# Patient Record
Sex: Female | Born: 1945
Health system: Southern US, Community
[De-identification: ages and names within clinical notes are randomized; demographics above are authoritative.]

## PROBLEM LIST (undated history)

## (undated) DIAGNOSIS — E7800 Pure hypercholesterolemia, unspecified: Secondary | ICD-10-CM

## (undated) DIAGNOSIS — E162 Hypoglycemia, unspecified: Secondary | ICD-10-CM

## (undated) DIAGNOSIS — E538 Deficiency of other specified B group vitamins: Secondary | ICD-10-CM

## (undated) DIAGNOSIS — I10 Essential (primary) hypertension: Secondary | ICD-10-CM

## (undated) DIAGNOSIS — E785 Hyperlipidemia, unspecified: Secondary | ICD-10-CM

## (undated) DIAGNOSIS — E119 Type 2 diabetes mellitus without complications: Secondary | ICD-10-CM

## (undated) DIAGNOSIS — M1712 Unilateral primary osteoarthritis, left knee: Secondary | ICD-10-CM

## (undated) DIAGNOSIS — D649 Anemia, unspecified: Secondary | ICD-10-CM

## (undated) DIAGNOSIS — F419 Anxiety disorder, unspecified: Secondary | ICD-10-CM

## (undated) DIAGNOSIS — E079 Disorder of thyroid, unspecified: Secondary | ICD-10-CM

## (undated) DIAGNOSIS — E039 Hypothyroidism, unspecified: Secondary | ICD-10-CM

## (undated) DIAGNOSIS — M810 Age-related osteoporosis without current pathological fracture: Secondary | ICD-10-CM

## (undated) DIAGNOSIS — M1990 Unspecified osteoarthritis, unspecified site: Secondary | ICD-10-CM

## (undated) DIAGNOSIS — L409 Psoriasis, unspecified: Secondary | ICD-10-CM

## (undated) HISTORY — PX: APPENDECTOMY: SHX54

## (undated) HISTORY — PX: CHOLECYSTECTOMY: SHX55

## (undated) HISTORY — PX: FOOT SURGERY: SHX648

## (undated) HISTORY — PX: TONSILLECTOMY: SUR1361

## (undated) HISTORY — DX: Unspecified osteoarthritis, unspecified site: M19.90

## (undated) HISTORY — PX: COLONOSCOPY: SHX174

## (undated) HISTORY — DX: Type 2 diabetes mellitus without complications: E11.9

---

## 2004-08-07 ENCOUNTER — Ambulatory Visit: Payer: Self-pay | Admitting: Unknown Physician Specialty

## 2005-06-08 ENCOUNTER — Ambulatory Visit: Payer: Self-pay | Admitting: Unknown Physician Specialty

## 2006-04-19 ENCOUNTER — Ambulatory Visit: Payer: Self-pay | Admitting: Unknown Physician Specialty

## 2006-05-18 ENCOUNTER — Ambulatory Visit: Payer: Self-pay | Admitting: Unknown Physician Specialty

## 2006-06-18 ENCOUNTER — Ambulatory Visit: Payer: Self-pay | Admitting: Unknown Physician Specialty

## 2006-07-05 ENCOUNTER — Ambulatory Visit: Payer: Self-pay | Admitting: Unknown Physician Specialty

## 2007-12-06 ENCOUNTER — Ambulatory Visit: Payer: Self-pay | Admitting: Unknown Physician Specialty

## 2007-12-13 ENCOUNTER — Ambulatory Visit: Payer: Self-pay | Admitting: Unknown Physician Specialty

## 2008-01-29 ENCOUNTER — Ambulatory Visit: Payer: Self-pay | Admitting: Unknown Physician Specialty

## 2008-11-28 ENCOUNTER — Ambulatory Visit: Payer: Self-pay | Admitting: Unknown Physician Specialty

## 2008-12-16 ENCOUNTER — Ambulatory Visit: Payer: Self-pay | Admitting: Unknown Physician Specialty

## 2009-01-02 ENCOUNTER — Ambulatory Visit: Payer: Self-pay | Admitting: Unknown Physician Specialty

## 2010-01-06 ENCOUNTER — Ambulatory Visit: Payer: Self-pay | Admitting: Unknown Physician Specialty

## 2010-06-04 ENCOUNTER — Encounter: Payer: Self-pay | Admitting: Sports Medicine

## 2010-06-18 ENCOUNTER — Encounter: Payer: Self-pay | Admitting: Sports Medicine

## 2010-07-18 ENCOUNTER — Encounter: Payer: Self-pay | Admitting: Sports Medicine

## 2011-01-14 ENCOUNTER — Ambulatory Visit: Payer: Self-pay | Admitting: Podiatry

## 2011-02-02 ENCOUNTER — Ambulatory Visit: Payer: Self-pay | Admitting: Family Medicine

## 2012-03-07 ENCOUNTER — Ambulatory Visit: Payer: Self-pay | Admitting: Family Medicine

## 2013-04-06 ENCOUNTER — Ambulatory Visit: Payer: Self-pay | Admitting: Family Medicine

## 2013-04-09 ENCOUNTER — Ambulatory Visit: Payer: Self-pay | Admitting: Unknown Physician Specialty

## 2014-04-08 ENCOUNTER — Emergency Department: Payer: Self-pay | Admitting: Emergency Medicine

## 2014-04-08 LAB — CBC WITH DIFFERENTIAL/PLATELET
BASOS PCT: 0.3 %
Basophil #: 0 10*3/uL (ref 0.0–0.1)
Eosinophil #: 0 10*3/uL (ref 0.0–0.7)
Eosinophil %: 0.3 %
HCT: 43.5 % (ref 35.0–47.0)
HGB: 13.7 g/dL (ref 12.0–16.0)
Lymphocyte #: 0.5 10*3/uL — ABNORMAL LOW (ref 1.0–3.6)
Lymphocyte %: 3.4 %
MCH: 30.3 pg (ref 26.0–34.0)
MCHC: 31.6 g/dL — ABNORMAL LOW (ref 32.0–36.0)
MCV: 96 fL (ref 80–100)
MONOS PCT: 4.6 %
Monocyte #: 0.6 x10 3/mm (ref 0.2–0.9)
NEUTROS PCT: 91.4 %
Neutrophil #: 12.5 10*3/uL — ABNORMAL HIGH (ref 1.4–6.5)
PLATELETS: 187 10*3/uL (ref 150–440)
RBC: 4.53 10*6/uL (ref 3.80–5.20)
RDW: 13.5 % (ref 11.5–14.5)
WBC: 13.7 10*3/uL — AB (ref 3.6–11.0)

## 2014-04-08 LAB — COMPREHENSIVE METABOLIC PANEL
ALT: 25 U/L (ref 12–78)
ANION GAP: 9 (ref 7–16)
Albumin: 4.1 g/dL (ref 3.4–5.0)
Alkaline Phosphatase: 88 U/L
BILIRUBIN TOTAL: 1 mg/dL (ref 0.2–1.0)
BUN: 21 mg/dL — AB (ref 7–18)
Calcium, Total: 10.2 mg/dL — ABNORMAL HIGH (ref 8.5–10.1)
Chloride: 102 mmol/L (ref 98–107)
Co2: 25 mmol/L (ref 21–32)
Creatinine: 1.14 mg/dL (ref 0.60–1.30)
EGFR (African American): 56 — ABNORMAL LOW
GFR CALC NON AF AMER: 49 — AB
Glucose: 344 mg/dL — ABNORMAL HIGH (ref 65–99)
Osmolality: 289 (ref 275–301)
Potassium: 4.7 mmol/L (ref 3.5–5.1)
SGOT(AST): 34 U/L (ref 15–37)
Sodium: 136 mmol/L (ref 136–145)
Total Protein: 7.8 g/dL (ref 6.4–8.2)

## 2014-04-08 LAB — URINALYSIS, COMPLETE
Bacteria: NONE SEEN
Bilirubin,UR: NEGATIVE
LEUKOCYTE ESTERASE: NEGATIVE
Nitrite: NEGATIVE
PH: 5 (ref 4.5–8.0)
Protein: NEGATIVE
RBC,UR: <1 /HPF (ref 0–5)
Specific Gravity: 1.029 (ref 1.003–1.030)
WBC UR: 1 /HPF (ref 0–5)

## 2014-05-24 ENCOUNTER — Ambulatory Visit: Payer: Self-pay | Admitting: Family Medicine

## 2015-06-18 ENCOUNTER — Other Ambulatory Visit: Payer: Self-pay | Admitting: Family Medicine

## 2015-06-18 DIAGNOSIS — Z1231 Encounter for screening mammogram for malignant neoplasm of breast: Secondary | ICD-10-CM

## 2015-07-02 ENCOUNTER — Ambulatory Visit: Payer: Self-pay | Attending: Family Medicine

## 2015-07-29 ENCOUNTER — Ambulatory Visit
Admission: RE | Admit: 2015-07-29 | Discharge: 2015-07-29 | Disposition: A | Discharge status: Home / Self Care | Payer: PPO | Source: Ambulatory Visit | Attending: Family Medicine | Admitting: Family Medicine

## 2015-07-29 DIAGNOSIS — Z1231 Encounter for screening mammogram for malignant neoplasm of breast: Secondary | ICD-10-CM | POA: Insufficient documentation

## 2015-10-22 DIAGNOSIS — E538 Deficiency of other specified B group vitamins: Secondary | ICD-10-CM | POA: Diagnosis not present

## 2015-10-27 DIAGNOSIS — E109 Type 1 diabetes mellitus without complications: Secondary | ICD-10-CM | POA: Diagnosis not present

## 2015-10-27 DIAGNOSIS — E108 Type 1 diabetes mellitus with unspecified complications: Secondary | ICD-10-CM | POA: Diagnosis not present

## 2015-10-31 DIAGNOSIS — E108 Type 1 diabetes mellitus with unspecified complications: Secondary | ICD-10-CM | POA: Diagnosis not present

## 2015-11-04 DIAGNOSIS — D2370 Other benign neoplasm of skin of unspecified lower limb, including hip: Secondary | ICD-10-CM | POA: Diagnosis not present

## 2015-11-04 DIAGNOSIS — M79671 Pain in right foot: Secondary | ICD-10-CM | POA: Diagnosis not present

## 2015-11-04 DIAGNOSIS — M79672 Pain in left foot: Secondary | ICD-10-CM | POA: Diagnosis not present

## 2015-11-18 DIAGNOSIS — E108 Type 1 diabetes mellitus with unspecified complications: Secondary | ICD-10-CM | POA: Diagnosis not present

## 2015-11-25 DIAGNOSIS — E10649 Type 1 diabetes mellitus with hypoglycemia without coma: Secondary | ICD-10-CM | POA: Diagnosis not present

## 2015-11-25 DIAGNOSIS — Z4681 Encounter for fitting and adjustment of insulin pump: Secondary | ICD-10-CM | POA: Diagnosis not present

## 2015-11-25 DIAGNOSIS — D510 Vitamin B12 deficiency anemia due to intrinsic factor deficiency: Secondary | ICD-10-CM | POA: Diagnosis not present

## 2015-11-26 DIAGNOSIS — M79672 Pain in left foot: Secondary | ICD-10-CM | POA: Diagnosis not present

## 2015-11-26 DIAGNOSIS — M79671 Pain in right foot: Secondary | ICD-10-CM | POA: Diagnosis not present

## 2015-11-26 DIAGNOSIS — D2370 Other benign neoplasm of skin of unspecified lower limb, including hip: Secondary | ICD-10-CM | POA: Diagnosis not present

## 2015-11-27 DIAGNOSIS — E108 Type 1 diabetes mellitus with unspecified complications: Secondary | ICD-10-CM | POA: Diagnosis not present

## 2015-11-27 DIAGNOSIS — E109 Type 1 diabetes mellitus without complications: Secondary | ICD-10-CM | POA: Diagnosis not present

## 2015-12-04 DIAGNOSIS — L400 Psoriasis vulgaris: Secondary | ICD-10-CM | POA: Diagnosis not present

## 2015-12-25 DIAGNOSIS — E108 Type 1 diabetes mellitus with unspecified complications: Secondary | ICD-10-CM | POA: Diagnosis not present

## 2015-12-25 DIAGNOSIS — E109 Type 1 diabetes mellitus without complications: Secondary | ICD-10-CM | POA: Diagnosis not present

## 2016-01-15 DIAGNOSIS — E7800 Pure hypercholesterolemia, unspecified: Secondary | ICD-10-CM | POA: Diagnosis not present

## 2016-01-15 DIAGNOSIS — I10 Essential (primary) hypertension: Secondary | ICD-10-CM | POA: Diagnosis not present

## 2016-01-15 DIAGNOSIS — E039 Hypothyroidism, unspecified: Secondary | ICD-10-CM | POA: Diagnosis not present

## 2016-01-22 DIAGNOSIS — E039 Hypothyroidism, unspecified: Secondary | ICD-10-CM | POA: Diagnosis not present

## 2016-01-22 DIAGNOSIS — Z0000 Encounter for general adult medical examination without abnormal findings: Secondary | ICD-10-CM | POA: Diagnosis not present

## 2016-01-22 DIAGNOSIS — I10 Essential (primary) hypertension: Secondary | ICD-10-CM | POA: Diagnosis not present

## 2016-01-22 DIAGNOSIS — E7800 Pure hypercholesterolemia, unspecified: Secondary | ICD-10-CM | POA: Diagnosis not present

## 2016-01-25 DIAGNOSIS — E108 Type 1 diabetes mellitus with unspecified complications: Secondary | ICD-10-CM | POA: Diagnosis not present

## 2016-01-25 DIAGNOSIS — E109 Type 1 diabetes mellitus without complications: Secondary | ICD-10-CM | POA: Diagnosis not present

## 2016-01-29 DIAGNOSIS — E108 Type 1 diabetes mellitus with unspecified complications: Secondary | ICD-10-CM | POA: Diagnosis not present

## 2016-02-06 DIAGNOSIS — Z79899 Other long term (current) drug therapy: Secondary | ICD-10-CM | POA: Diagnosis not present

## 2016-02-06 DIAGNOSIS — L400 Psoriasis vulgaris: Secondary | ICD-10-CM | POA: Diagnosis not present

## 2016-02-18 DIAGNOSIS — E10649 Type 1 diabetes mellitus with hypoglycemia without coma: Secondary | ICD-10-CM | POA: Diagnosis not present

## 2016-02-24 DIAGNOSIS — E108 Type 1 diabetes mellitus with unspecified complications: Secondary | ICD-10-CM | POA: Diagnosis not present

## 2016-02-24 DIAGNOSIS — E109 Type 1 diabetes mellitus without complications: Secondary | ICD-10-CM | POA: Diagnosis not present

## 2016-02-26 DIAGNOSIS — E1042 Type 1 diabetes mellitus with diabetic polyneuropathy: Secondary | ICD-10-CM | POA: Diagnosis not present

## 2016-02-26 DIAGNOSIS — E10649 Type 1 diabetes mellitus with hypoglycemia without coma: Secondary | ICD-10-CM | POA: Diagnosis not present

## 2016-02-26 DIAGNOSIS — Z4681 Encounter for fitting and adjustment of insulin pump: Secondary | ICD-10-CM | POA: Diagnosis not present

## 2016-02-26 DIAGNOSIS — D510 Vitamin B12 deficiency anemia due to intrinsic factor deficiency: Secondary | ICD-10-CM | POA: Diagnosis not present

## 2016-03-05 DIAGNOSIS — L400 Psoriasis vulgaris: Secondary | ICD-10-CM | POA: Diagnosis not present

## 2016-03-18 DIAGNOSIS — E039 Hypothyroidism, unspecified: Secondary | ICD-10-CM | POA: Diagnosis not present

## 2016-03-18 DIAGNOSIS — I10 Essential (primary) hypertension: Secondary | ICD-10-CM | POA: Diagnosis not present

## 2016-03-18 DIAGNOSIS — E1042 Type 1 diabetes mellitus with diabetic polyneuropathy: Secondary | ICD-10-CM | POA: Diagnosis not present

## 2016-03-18 DIAGNOSIS — E7800 Pure hypercholesterolemia, unspecified: Secondary | ICD-10-CM | POA: Diagnosis not present

## 2016-03-26 DIAGNOSIS — E109 Type 1 diabetes mellitus without complications: Secondary | ICD-10-CM | POA: Diagnosis not present

## 2016-03-26 DIAGNOSIS — E108 Type 1 diabetes mellitus with unspecified complications: Secondary | ICD-10-CM | POA: Diagnosis not present

## 2016-04-25 DIAGNOSIS — E108 Type 1 diabetes mellitus with unspecified complications: Secondary | ICD-10-CM | POA: Diagnosis not present

## 2016-04-25 DIAGNOSIS — E109 Type 1 diabetes mellitus without complications: Secondary | ICD-10-CM | POA: Diagnosis not present

## 2016-04-29 DIAGNOSIS — E108 Type 1 diabetes mellitus with unspecified complications: Secondary | ICD-10-CM | POA: Diagnosis not present

## 2016-04-30 DIAGNOSIS — L400 Psoriasis vulgaris: Secondary | ICD-10-CM | POA: Diagnosis not present

## 2016-05-14 DIAGNOSIS — L400 Psoriasis vulgaris: Secondary | ICD-10-CM | POA: Diagnosis not present

## 2016-05-14 DIAGNOSIS — Z5181 Encounter for therapeutic drug level monitoring: Secondary | ICD-10-CM | POA: Diagnosis not present

## 2016-05-14 DIAGNOSIS — E039 Hypothyroidism, unspecified: Secondary | ICD-10-CM | POA: Diagnosis not present

## 2016-05-14 DIAGNOSIS — E7800 Pure hypercholesterolemia, unspecified: Secondary | ICD-10-CM | POA: Diagnosis not present

## 2016-05-19 DIAGNOSIS — M25511 Pain in right shoulder: Secondary | ICD-10-CM | POA: Diagnosis not present

## 2016-05-19 DIAGNOSIS — M19011 Primary osteoarthritis, right shoulder: Secondary | ICD-10-CM | POA: Diagnosis not present

## 2016-05-19 DIAGNOSIS — I10 Essential (primary) hypertension: Secondary | ICD-10-CM | POA: Diagnosis not present

## 2016-05-19 DIAGNOSIS — G8929 Other chronic pain: Secondary | ICD-10-CM | POA: Diagnosis not present

## 2016-05-19 DIAGNOSIS — E7800 Pure hypercholesterolemia, unspecified: Secondary | ICD-10-CM | POA: Diagnosis not present

## 2016-05-19 DIAGNOSIS — E039 Hypothyroidism, unspecified: Secondary | ICD-10-CM | POA: Diagnosis not present

## 2016-05-26 DIAGNOSIS — E109 Type 1 diabetes mellitus without complications: Secondary | ICD-10-CM | POA: Diagnosis not present

## 2016-05-26 DIAGNOSIS — E108 Type 1 diabetes mellitus with unspecified complications: Secondary | ICD-10-CM | POA: Diagnosis not present

## 2016-06-03 DIAGNOSIS — E1042 Type 1 diabetes mellitus with diabetic polyneuropathy: Secondary | ICD-10-CM | POA: Diagnosis not present

## 2016-06-03 DIAGNOSIS — E063 Autoimmune thyroiditis: Secondary | ICD-10-CM | POA: Diagnosis not present

## 2016-06-03 DIAGNOSIS — E10649 Type 1 diabetes mellitus with hypoglycemia without coma: Secondary | ICD-10-CM | POA: Diagnosis not present

## 2016-06-03 DIAGNOSIS — D510 Vitamin B12 deficiency anemia due to intrinsic factor deficiency: Secondary | ICD-10-CM | POA: Diagnosis not present

## 2016-06-03 DIAGNOSIS — E785 Hyperlipidemia, unspecified: Secondary | ICD-10-CM | POA: Diagnosis not present

## 2016-06-03 DIAGNOSIS — E038 Other specified hypothyroidism: Secondary | ICD-10-CM | POA: Diagnosis not present

## 2016-06-03 DIAGNOSIS — Z4681 Encounter for fitting and adjustment of insulin pump: Secondary | ICD-10-CM | POA: Diagnosis not present

## 2016-06-08 DIAGNOSIS — G8929 Other chronic pain: Secondary | ICD-10-CM | POA: Diagnosis not present

## 2016-06-08 DIAGNOSIS — M25511 Pain in right shoulder: Secondary | ICD-10-CM | POA: Diagnosis not present

## 2016-06-15 DIAGNOSIS — M25511 Pain in right shoulder: Secondary | ICD-10-CM | POA: Diagnosis not present

## 2016-06-15 DIAGNOSIS — G8929 Other chronic pain: Secondary | ICD-10-CM | POA: Diagnosis not present

## 2016-06-17 DIAGNOSIS — M25511 Pain in right shoulder: Secondary | ICD-10-CM | POA: Diagnosis not present

## 2016-06-17 DIAGNOSIS — G8929 Other chronic pain: Secondary | ICD-10-CM | POA: Diagnosis not present

## 2016-06-23 DIAGNOSIS — G8929 Other chronic pain: Secondary | ICD-10-CM | POA: Diagnosis not present

## 2016-06-23 DIAGNOSIS — M25511 Pain in right shoulder: Secondary | ICD-10-CM | POA: Diagnosis not present

## 2016-06-26 DIAGNOSIS — E109 Type 1 diabetes mellitus without complications: Secondary | ICD-10-CM | POA: Diagnosis not present

## 2016-06-26 DIAGNOSIS — E108 Type 1 diabetes mellitus with unspecified complications: Secondary | ICD-10-CM | POA: Diagnosis not present

## 2016-06-29 DIAGNOSIS — M25511 Pain in right shoulder: Secondary | ICD-10-CM | POA: Diagnosis not present

## 2016-06-29 DIAGNOSIS — G8929 Other chronic pain: Secondary | ICD-10-CM | POA: Diagnosis not present

## 2016-07-15 ENCOUNTER — Other Ambulatory Visit: Payer: Self-pay | Admitting: Family Medicine

## 2016-07-15 DIAGNOSIS — Z1231 Encounter for screening mammogram for malignant neoplasm of breast: Secondary | ICD-10-CM

## 2016-07-19 DIAGNOSIS — M79671 Pain in right foot: Secondary | ICD-10-CM | POA: Diagnosis not present

## 2016-07-19 DIAGNOSIS — M79672 Pain in left foot: Secondary | ICD-10-CM | POA: Diagnosis not present

## 2016-07-19 DIAGNOSIS — D2370 Other benign neoplasm of skin of unspecified lower limb, including hip: Secondary | ICD-10-CM | POA: Diagnosis not present

## 2016-07-21 DIAGNOSIS — Z23 Encounter for immunization: Secondary | ICD-10-CM | POA: Diagnosis not present

## 2016-07-26 DIAGNOSIS — E109 Type 1 diabetes mellitus without complications: Secondary | ICD-10-CM | POA: Diagnosis not present

## 2016-07-26 DIAGNOSIS — E108 Type 1 diabetes mellitus with unspecified complications: Secondary | ICD-10-CM | POA: Diagnosis not present

## 2016-07-29 DIAGNOSIS — Z5181 Encounter for therapeutic drug level monitoring: Secondary | ICD-10-CM | POA: Diagnosis not present

## 2016-07-29 DIAGNOSIS — L400 Psoriasis vulgaris: Secondary | ICD-10-CM | POA: Diagnosis not present

## 2016-07-29 DIAGNOSIS — L853 Xerosis cutis: Secondary | ICD-10-CM | POA: Diagnosis not present

## 2016-08-02 DIAGNOSIS — N390 Urinary tract infection, site not specified: Secondary | ICD-10-CM | POA: Diagnosis not present

## 2016-08-12 ENCOUNTER — Ambulatory Visit
Admission: RE | Admit: 2016-08-12 | Discharge: 2016-08-12 | Disposition: A | Discharge status: Home / Self Care | Payer: PPO | Source: Ambulatory Visit | Attending: Family Medicine | Admitting: Family Medicine

## 2016-08-12 DIAGNOSIS — Z1231 Encounter for screening mammogram for malignant neoplasm of breast: Secondary | ICD-10-CM

## 2016-08-17 DIAGNOSIS — E10649 Type 1 diabetes mellitus with hypoglycemia without coma: Secondary | ICD-10-CM | POA: Diagnosis not present

## 2016-08-17 DIAGNOSIS — E063 Autoimmune thyroiditis: Secondary | ICD-10-CM | POA: Diagnosis not present

## 2016-08-17 DIAGNOSIS — E038 Other specified hypothyroidism: Secondary | ICD-10-CM | POA: Diagnosis not present

## 2016-08-17 DIAGNOSIS — D510 Vitamin B12 deficiency anemia due to intrinsic factor deficiency: Secondary | ICD-10-CM | POA: Diagnosis not present

## 2016-08-17 DIAGNOSIS — E1042 Type 1 diabetes mellitus with diabetic polyneuropathy: Secondary | ICD-10-CM | POA: Diagnosis not present

## 2016-08-23 DIAGNOSIS — Z8639 Personal history of other endocrine, nutritional and metabolic disease: Secondary | ICD-10-CM | POA: Diagnosis not present

## 2016-08-23 DIAGNOSIS — D2370 Other benign neoplasm of skin of unspecified lower limb, including hip: Secondary | ICD-10-CM | POA: Diagnosis not present

## 2016-08-23 DIAGNOSIS — M79672 Pain in left foot: Secondary | ICD-10-CM | POA: Diagnosis not present

## 2016-08-23 DIAGNOSIS — M79671 Pain in right foot: Secondary | ICD-10-CM | POA: Diagnosis not present

## 2016-08-26 DIAGNOSIS — E108 Type 1 diabetes mellitus with unspecified complications: Secondary | ICD-10-CM | POA: Diagnosis not present

## 2016-08-26 DIAGNOSIS — E109 Type 1 diabetes mellitus without complications: Secondary | ICD-10-CM | POA: Diagnosis not present

## 2016-08-27 DIAGNOSIS — E1042 Type 1 diabetes mellitus with diabetic polyneuropathy: Secondary | ICD-10-CM | POA: Diagnosis not present

## 2016-09-01 DIAGNOSIS — E119 Type 2 diabetes mellitus without complications: Secondary | ICD-10-CM | POA: Diagnosis not present

## 2016-09-14 DIAGNOSIS — E7800 Pure hypercholesterolemia, unspecified: Secondary | ICD-10-CM | POA: Diagnosis not present

## 2016-09-14 DIAGNOSIS — Z8639 Personal history of other endocrine, nutritional and metabolic disease: Secondary | ICD-10-CM | POA: Diagnosis not present

## 2016-09-14 DIAGNOSIS — D2370 Other benign neoplasm of skin of unspecified lower limb, including hip: Secondary | ICD-10-CM | POA: Diagnosis not present

## 2016-09-14 DIAGNOSIS — M79672 Pain in left foot: Secondary | ICD-10-CM | POA: Diagnosis not present

## 2016-09-14 DIAGNOSIS — M79671 Pain in right foot: Secondary | ICD-10-CM | POA: Diagnosis not present

## 2016-09-14 DIAGNOSIS — I10 Essential (primary) hypertension: Secondary | ICD-10-CM | POA: Diagnosis not present

## 2016-09-21 DIAGNOSIS — E7800 Pure hypercholesterolemia, unspecified: Secondary | ICD-10-CM | POA: Diagnosis not present

## 2016-09-21 DIAGNOSIS — E669 Obesity, unspecified: Secondary | ICD-10-CM | POA: Diagnosis not present

## 2016-09-21 DIAGNOSIS — Z23 Encounter for immunization: Secondary | ICD-10-CM | POA: Diagnosis not present

## 2016-09-21 DIAGNOSIS — E559 Vitamin D deficiency, unspecified: Secondary | ICD-10-CM | POA: Diagnosis not present

## 2016-09-21 DIAGNOSIS — I10 Essential (primary) hypertension: Secondary | ICD-10-CM | POA: Diagnosis not present

## 2016-10-25 DIAGNOSIS — M25511 Pain in right shoulder: Secondary | ICD-10-CM | POA: Diagnosis not present

## 2016-10-25 DIAGNOSIS — G8929 Other chronic pain: Secondary | ICD-10-CM | POA: Diagnosis not present

## 2016-10-26 ENCOUNTER — Other Ambulatory Visit: Payer: Self-pay | Admitting: Family Medicine

## 2016-10-26 DIAGNOSIS — G8929 Other chronic pain: Secondary | ICD-10-CM

## 2016-10-26 DIAGNOSIS — M25511 Pain in right shoulder: Principal | ICD-10-CM

## 2016-10-27 DIAGNOSIS — Z5181 Encounter for therapeutic drug level monitoring: Secondary | ICD-10-CM | POA: Diagnosis not present

## 2016-10-27 DIAGNOSIS — L400 Psoriasis vulgaris: Secondary | ICD-10-CM | POA: Diagnosis not present

## 2016-10-29 DIAGNOSIS — L400 Psoriasis vulgaris: Secondary | ICD-10-CM | POA: Diagnosis not present

## 2016-11-03 ENCOUNTER — Ambulatory Visit: Payer: PPO

## 2016-11-09 DIAGNOSIS — E039 Hypothyroidism, unspecified: Secondary | ICD-10-CM | POA: Diagnosis not present

## 2016-11-09 DIAGNOSIS — E785 Hyperlipidemia, unspecified: Secondary | ICD-10-CM | POA: Diagnosis not present

## 2016-11-11 ENCOUNTER — Ambulatory Visit: Payer: PPO

## 2016-11-11 DIAGNOSIS — G8929 Other chronic pain: Secondary | ICD-10-CM | POA: Diagnosis not present

## 2016-11-11 DIAGNOSIS — M25511 Pain in right shoulder: Secondary | ICD-10-CM | POA: Diagnosis not present

## 2016-11-13 ENCOUNTER — Ambulatory Visit: Payer: PPO

## 2016-11-18 ENCOUNTER — Ambulatory Visit
Admission: RE | Admit: 2016-11-18 | Discharge: 2016-11-18 | Disposition: A | Discharge status: Home / Self Care | Payer: PPO | Source: Ambulatory Visit | Attending: Family Medicine | Admitting: Family Medicine

## 2016-11-18 DIAGNOSIS — G8929 Other chronic pain: Secondary | ICD-10-CM | POA: Insufficient documentation

## 2016-11-18 DIAGNOSIS — M25511 Pain in right shoulder: Secondary | ICD-10-CM | POA: Diagnosis not present

## 2016-11-18 DIAGNOSIS — M19011 Primary osteoarthritis, right shoulder: Secondary | ICD-10-CM | POA: Diagnosis not present

## 2016-11-26 DIAGNOSIS — M7581 Other shoulder lesions, right shoulder: Secondary | ICD-10-CM | POA: Diagnosis not present

## 2016-11-26 DIAGNOSIS — E1042 Type 1 diabetes mellitus with diabetic polyneuropathy: Secondary | ICD-10-CM | POA: Diagnosis not present

## 2016-11-26 DIAGNOSIS — M7541 Impingement syndrome of right shoulder: Secondary | ICD-10-CM | POA: Diagnosis not present

## 2016-12-06 ENCOUNTER — Ambulatory Visit: Payer: PPO | Attending: Surgery | Admitting: Physical Therapy

## 2016-12-06 ENCOUNTER — Encounter: Payer: Self-pay | Admitting: Physical Therapy

## 2016-12-06 DIAGNOSIS — M25511 Pain in right shoulder: Secondary | ICD-10-CM

## 2016-12-06 DIAGNOSIS — M6281 Muscle weakness (generalized): Secondary | ICD-10-CM

## 2016-12-07 NOTE — Therapy (Signed)
[place] [facility] [address] [place], [address] Phone: [phone]   Fax:  [phone]  Physical Therapy Evaluation  Patient Details  Name: Christina Chen MRN: [id number] Date of Birth: 13-Mar-1946 Referring Provider: Milagros Evener MD  Encounter Date: 12/06/2016      PT End of Session - 12/06/16 0936    Visit Number 1   Number of Visits 12   Date for PT Re-Evaluation 01/17/17   Authorization Type 1   Authorization Time Period 10 (G code)   PT Start Time 0800   PT Stop Time 0907   PT Time Calculation (min) 67 min   Activity Tolerance Patient tolerated treatment well;Patient limited by pain   Behavior During Therapy [facility] for tasks assessed/performed      Past Medical History:  Diagnosis Date  . Arthritis   . Diabetes mellitus without complication (HCC)    Type I insulin pump    Past Surgical History:  Procedure Laterality Date  . APPENDECTOMY      There were no vitals filed for this visit.       Subjective Assessment - 12/06/16 0828    Subjective Patient reports her shoulder hurts intermittently, whether still or moving and she can't sleep on her right side, can't brush her hair or raise arm overhead or reach forward because of the pain. Overall she felt like it was improving following injections and now is about the same.    Pertinent History Patient reprots history of right shoulder pain, insidious onset about 6 months ago. She has had previous physical therapy in which she had minimal to no results and discontinued therapy. She then was referred to Dr. Roland Rack and also reports havin gmultiple injections into right shoulder with niminal results as well. She is now referred for physical therapy due to continued pain and limitations with function.    Limitations Lifting;House hold activities;Other (comment)  reaching forward, overhead, pushing   Diagnostic tests MRI right shoulder: results in chart:  rotator cuff tendinopathy most notable in supraspinatus tendon, moderately severe arthritis AC joint   Patient Stated Goals decrease pain in right shoulder to be able to raise arm above shoulder level reach for personal care and household chores, driving   Currently in Pain? Yes   Pain Score 2   best 2/10, worst 6/10   Pain Location Shoulder   Pain Orientation Right   Pain Descriptors / Indicators Aching;Throbbing   Pain Type Chronic pain   Pain Onset More than a month ago   Pain Frequency Constant   Aggravating Factors  raising arm up overhead, pushing, lifing    Pain Relieving Factors rest, medication, ice, heat   Effect of Pain on Daily Activities limitiing personal care, activties around the home, driving            [facility] PT Assessment - 12/06/16 0817      Assessment   Medical Diagnosis Rotator cuff tendinitis, right M75.81   Referring Provider Milagros Evener MD   Onset Date/Surgical Date 05/18/16   Hand Dominance Right   Next MD Visit unknown   Prior Therapy Yes within in the past 6 months     Precautions   Precautions None     Balance Screen   Has the patient fallen in the past 6 months No   Has the patient had a decrease in activity level because of a fear of falling?  No   Is the patient reluctant to leave  their home because of a fear of falling?  No     Home Environment   Living Environment Private residence   Living Arrangements Spouse/significant other   Type of [place] to enter   Entrance Stairs-Number of Steps 2   Entrance Stairs-Rails Can reach both   [place] Two level   Alternate Level Stairs-Number of Steps 10   Alternate Level Stairs-Rails Can reach both     Prior Function   Level of Independence Independent   Vocation Retired  worked in a [employer] read, walking, spend time with family     Cognition   Overall Cognitive Status Within Functional Limits for tasks assessed     Objective; Observation;  Posture:  right shoulder elevated as compared to left shoulder with spasms in upper trapezius  AROM:  Right UE; shoulder flexion 120, IR to L5, ER WFL with stiffness, pain Left UE: shoulder flexion 140, IR to T12, ER WNL no pain  PROM:  Right UE: supine lying: shoulder forward elevation to 120 degrees with pain limiting further motion, ER 0-60 with pain, ER 0-60 with pain  Strength: Right UE: shoulder flexion, ER, IR strong and non painful with isometric testing and generally equal to left UE Scapular retraction: decreased control, strength right as compared to left  Special testing: Impingement testing Neers and Michel Bickers right UE (+) right shoulder Speeds test for biceps (+) right UE for reproduction of pain   Sensation: intact to light touch bilateral UE's  Palpation: (+) point tender with increased tone, thickness in tendons right shoulder anterior aspect and (+) spasms and tenderness right upper trapezius  Outcome measure: QuickDash 50% (0 = no self perceived disability)  Treatment: Manual therapy:  STM performed to right shoulder with instruction for self massage at home to anterior aspect over tender areas, STM performed to right upper trapezius for spasm reduction and pain control  Therapeutic exercise: patient performed exercises with verbal, tactile cues  demonstration of therapist: Scapular retraction supine lying with VC and tactile cues for proper muscle activation 10 reps Standing at door performed scapular retraction following demonstration and with VC AAROM supine lying: instructed in self AAROM using left UE for assist for forward elevation; performed 5 reps with assist of therapist with VC   Patient response to treatment: patient demonstrated improved technique with exercises with tactile cues and moderate VC for correct alignment. Patient reported increased pain with forward elevation in supine and with self AAROM above 100 degrees flexion. Patient with decreased  spasms and pain in right shoulder by 50% following STM. Improved motor control with repetition and cuing.         PT Education - 12/06/16 0900    Education provided Yes   Education Details home program; educated in self STM right shoulder 5 min./dsy, scapular retraction throughout the day, AAROM supine lying, positioning right UE in sitting and lying to avoid impingement, precautions for moving right UE during daily activities ie no crossing midline, reachning behind back or above shoulder level    Person(s) Educated Patient   Methods Explanation;Demonstration;Verbal cues;Handout   Comprehension Verbalized understanding;Returned demonstration;Verbal cues required;Need further instruction             PT Long Term Goals - 12/06/16 0910      PT LONG TERM GOAL #1   Title Patient will demonstrate improved function with right UE with decreased pain and less difficulty as indicated by QuickDash score of 25%  or less by 01/04/2017   Baseline quick Dash 50% (0 = no self perceived disabiltiy)   Status New     PT LONG TERM GOAL #2   Title Patient will be able to reach overhead and behind back for personal care activties of hair care and dressing with pain <2/10 and mild to no difficulty by 01/17/2017    Baseline unable to perform hair care or reach behind back due to pain   Status New     PT LONG TERM GOAL #3   Title Patient will be independent with home program for pain control, exercise and appropriate progression in order to self manage symptoms and continue to improve once discharged from physical therapy by 01/17/2017   Baseline limited knowledge of appropriate pain control strategies, progression of exercises    Status New               Plan - 12/06/16 [id number]    Clinical Impression Statement Patient is a right hand dominant female who presents with pain in right shoulder, decreased strength and limited AROM and PROM which limits functional use of right UE for personal care, driving,  household chores. She has a Quick Dash score of 50% indicating moderate self perceived disabiltiy. She has limited knowledge of appropriate pain control strategies, exercises and progression in order to return to prior level of function and will therefore benefit from physical therapy intervention.    Rehab Potential Good   Clinical Impairments Affecting Rehab Potential (+)motivated, prior level of function (-)chronic condition, diabetes   PT Frequency 2x / week   PT Duration 6 weeks   PT Treatment/Interventions Cryotherapy;Moist Heat;Ultrasound;Patient/family education;Neuromuscular re-education;Therapeutic exercise;Manual techniques   PT Next Visit Plan manual therpay, modalities for pain control, reduction of spasms, therapeutic exercise for ROm, strength    PT Home Exercise Plan positioning, self massage, scapular retraction, AAROM right UE   Consulted and Agree with Plan of Care Patient      Patient will benefit from skilled therapeutic intervention in order to improve the following deficits and impairments:  Decreased strength, Pain, Impaired perceived functional ability, Decreased activity tolerance, Increased muscle spasms, Decreased range of motion  Visit Diagnosis: Right shoulder pain, unspecified chronicity - Plan: PT plan of care cert/re-cert  Muscle weakness (generalized) - Plan: PT plan of care cert/re-cert      G-Codes - [id number]    Functional Assessment Tool Used Clinical judgement   Functional Assessment Tool Used quickDash, ROM, strength deficits   Functional Limitation Carrying, moving and handling objects   Carrying, Moving and Handling Objects Current Status [id number]) At least 40 percent but less than 60 percent impaired, limited or restricted   Carrying, Moving and Handling Objects Goal Status [id number]) At least 1 percent but less than 20 percent impaired, limited or restricted       Problem List There are no active problems to display for this  patient.   Jomarie Longs PT 12/07/2016, 12:21 PM  [place] [facility] [address], [place], [address] Phone: [phone]   Fax:  [phone]  Name: Christina Chen MRN: [id number] Date of Birth: 10-Feb-1946

## 2016-12-09 ENCOUNTER — Encounter: Payer: Self-pay | Admitting: Physical Therapy

## 2016-12-09 ENCOUNTER — Ambulatory Visit: Payer: PPO | Admitting: Physical Therapy

## 2016-12-09 DIAGNOSIS — M25511 Pain in right shoulder: Secondary | ICD-10-CM | POA: Diagnosis not present

## 2016-12-09 DIAGNOSIS — M6281 Muscle weakness (generalized): Secondary | ICD-10-CM

## 2016-12-10 NOTE — Therapy (Signed)
[place] [facility] [address] [place], [address] Phone: [phone]   Fax:  [phone]  Physical Therapy Treatment  Patient Details  Name: Christina Chen MRN: [id number] Date of Birth: [birth year] Referring Provider: Milagros Evener MD  Encounter Date: 12/09/2016      PT End of Session - 12/09/16 1128    Visit Number 2   Number of Visits 12   Date for PT Re-Evaluation 01/17/17   Authorization Type 2   Authorization Time Period 10 (G code)   PT Start Time 1119   PT Stop Time 1212   PT Time Calculation (min) 53 min   Activity Tolerance Patient tolerated treatment well;Patient limited by pain   Behavior During Therapy [facility] for tasks assessed/performed      Past Medical History:  Diagnosis Date  . Arthritis   . Diabetes mellitus without complication (HCC)    Type I insulin pump    Past Surgical History:  Procedure Laterality Date  . APPENDECTOMY      There were no vitals filed for this visit.      Subjective Assessment - 12/09/16 1121    Subjective Patient reports she felt a lot better following previous session and has not been able to be as consistent with home program due to a lot going on at home.    Limitations Lifting;House hold activities;Other (comment)   Diagnostic tests MRI right shoulder: results in chart: rotator cuff tendinopathy most notable in supraspinatus tendon, moderately severe arthritis AC joint   Patient Stated Goals decrease pain in right shoulder to be able to raise arm above shoulder level reach for personal care and household chores, driving   Currently in Pain? No/denies        Treatment: Manual therapy:  STM performed to right shoulder anterior aspect over tender areas, right upper trapezius with patient seated and supine in conjunction with exercises: for spasm reduction and pain control  Therapeutic exercise: patient performed exercises with verbal, tactile cues   demonstration of therapist: Sitting:  Scapular retraction with VC and tactile cues for proper muscle activation 10 reps Standing at door performed scapular retraction following demonstration and with VC AAROM supine lying:re instructed in self AAROM using left UE for assist for forward elevation; performed 5 reps with assist of therapist with VC  Rhythmic stabilization with right UE @ 90 degrees elevation 3 x 10 reps with mild resistance and VC to stabilize; ER/IR in neutral 3 x 10 Patient performed stabilization with small circle 10x each cw/ccw with right UE @ 90 degrees   Modalities:  Ultrasound pulsed 50% 1MHz 1.2 w/cm2 x 10 min performed to right shoulder superior and anterior aspect with patient seated, right UE supported on pillow Moist heat applied to right shoulder at end of session following treatment x 10 min.(no charge) : no adverse skin reactions noted  Patient response to treatment: Patient demonstrated improved technique  With exercises with tactile and verbal cues for correct alignment of shoulder. Patient with decreased spasms and tenderness in right shoulder by 50% following STM/US treatment. Patient improved motor control with exercises with repetition        PT Education - 12/09/16 1200    Education provided Yes   Education Details HEP re inforced and re instructed in supine lying exercises exercises; educated in use of [place] for pain control prior to treatment.    Person(s) Educated Patient   Methods Explanation;Demonstration;Verbal cues   Comprehension Verbalized understanding;Returned demonstration;Verbal  cues required             PT Long Term Goals - 12/06/16 0910      PT LONG TERM GOAL #1   Title Patient will demonstrate improved function with right UE with decreased pain and less difficulty as indicated by QuickDash score of 25% or less by 01/04/2017   Baseline quick Dash 50% (0 = no self perceived disabiltiy)   Status New     PT LONG TERM GOAL #2    Title Patient will be able to reach overhead and behind back for personal care activties of hair care and dressing with pain <2/10 and mild to no difficulty by 01/17/2017    Baseline unable to perform hair care or reach behind back due to pain   Status New     PT LONG TERM GOAL #3   Title Patient will be independent with home program for pain control, exercise and appropriate progression in order to self manage symptoms and continue to improve once discharged from physical therapy by 01/17/2017   Baseline limited knowledge of appropriate pain control strategies, progression of exercises    Status New               Plan - 12/09/16 1233    Clinical Impression Statement Patient demonstrated decreased pain with treatment of [place] with improved abiltiy to raise arm to shoulder level with less difficulty and pain. She is progressing with goals and required minimal cuing to perform exercise with correct alignment and technique.    Rehab Potential Good   PT Frequency 2x / week   PT Duration 6 weeks   PT Treatment/Interventions Cryotherapy;Moist Heat;Ultrasound;Patient/family education;Neuromuscular re-education;Therapeutic exercise;Manual techniques   PT Next Visit Plan manual therpay, modalities for pain control, reduction of spasms, therapeutic exercise for ROm, strength    PT Home Exercise Plan positioning, self massage, scapular retraction, AAROM right UE      Patient will benefit from skilled therapeutic intervention in order to improve the following deficits and impairments:  Decreased strength, Pain, Impaired perceived functional ability, Decreased activity tolerance, Increased muscle spasms, Decreased range of motion  Visit Diagnosis: Right shoulder pain, unspecified chronicity  Muscle weakness (generalized)     Problem List There are no active problems to display for this patient.   Jomarie Longs PT 12/10/2016, 1:57 PM  [place] [facility] [address], [place], [address] Phone: [phone]   Fax:  [phone]  Name: Christina Chen MRN: [id number] Date of Birth: 1946-01-14

## 2016-12-13 ENCOUNTER — Ambulatory Visit: Payer: PPO | Admitting: Physical Therapy

## 2016-12-13 ENCOUNTER — Encounter: Payer: Self-pay | Admitting: Physical Therapy

## 2016-12-13 DIAGNOSIS — M25511 Pain in right shoulder: Secondary | ICD-10-CM | POA: Diagnosis not present

## 2016-12-13 DIAGNOSIS — M6281 Muscle weakness (generalized): Secondary | ICD-10-CM

## 2016-12-13 NOTE — Therapy (Signed)
[place] [facility] [address], [place], [address] Phone: [phone]   Fax:  [phone]  Physical Therapy Treatment  Patient Details  Name: Christina Chen MRN: [id number] Date of Birth: 07/11/1946 Referring Provider: Milagros Evener MD  Encounter Date: 12/13/2016      PT End of Session - 12/13/16 1005    Visit Number 3   Number of Visits 12   Date for PT Re-Evaluation 01/17/17   Authorization Type 3   Authorization Time Period 10 (G code)   PT Start Time 1000   PT Stop Time 1048   PT Time Calculation (min) 48 min   Activity Tolerance Patient tolerated treatment well;Patient limited by pain   Behavior During Therapy [facility] for tasks assessed/performed      Past Medical History:  Diagnosis Date  . Arthritis   . Diabetes mellitus without complication (HCC)    Type I insulin pump    Past Surgical History:  Procedure Laterality Date  . APPENDECTOMY      There were no vitals filed for this visit.      Subjective Assessment - 12/13/16 1003    Subjective Patient reports she is still seeing improvement with current treatment. She continues with intermittment pain into right upper arm and feels the cold and rainy weather has an effect on her pain today. She is still unable to raise arm overhead without increased pain.    Limitations Lifting;House hold activities;Other (comment)   Diagnostic tests MRI right shoulder: results in chart: rotator cuff tendinopathy most notable in supraspinatus tendon, moderately severe arthritis AC joint   Patient Stated Goals decrease pain in right shoulder to be able to raise arm above shoulder level reach for personal care and household chores, driving   Currently in Pain? No/denies     Objective:  AROM: right shoulder flexion seated 0-100 AAROM supine lying 140 without pain Palpation: mild spasms right upper trapezius and mild tenderness, thickness anterior aspect of right  shoulder pre treatment    Treatment: Manual therapy: STM performed to right shoulder anterior aspect over tender areas, right upper trapezius with patient supine in conjunction with exercises: for spasm reduction and pain control  Therapeutic exercise: patient performed exercises with verbal, tactile cues demonstration of therapist: Standing at door performed scapular retraction following demonstration and with VC x 10 reps with improved posture, decreased forward shoulder right and decreased forward head posture AAROM supine lying:AAROM with assistance of therapist to 140 degrees flexion without increased pain Rhythmic stabilization with right UE @ 90 degrees elevation 3 x 10 reps with mild resistance and VC to stabilize; ER/IR in neutral 3 x 10 Patient performed stabilization with small circle 10x each cw/ccw with right UE @ 90 degrees holding 2# weight with guarding of therapist, x 10 with 1# weight ER right shoulder off trunk with right arm supported on towel 2# weight x 10 reps, 1# weight x 10 reps Instructed patient in use of pulleys for ROM with correct UE positioning to avoid impingement of right shoulder, performed x 2-3 min.s with VC for correct technique following demonstration   Modalities:  Ultrasound pulsed 50% 1MHz 1.2 w/cm2 x 10 min performed to right shoulder superior and anterior aspect with patient seated, right UE supported on pillow Moist heat applied to right shoulder at end of session following treatment x 10 min.(no charge) : no adverse skin reactions noted  Patient response to treatment: Patient demonstrated improved technique with exercises with moderate  cuing for correct positioning, shoulder alignment. Patient demonstrated decreased soreness, spasms to very mild following STM, [place] and no soreness at end of session following moist heat. She verbalized good understanding of home program.           PT Education - 12/13/16 1030    Education provided Yes    Education Details HEP; re assessed home program for stabilization in [place], added weights to exercises, instructed in use of pulleys for ROM   Person(s) Educated Patient   Methods Explanation;Demonstration;Verbal cues;Handout   Comprehension Verbalized understanding;Returned demonstration;Verbal cues required;Need further instruction             PT Long Term Goals - 12/06/16 0910      PT LONG TERM GOAL #1   Title Patient will demonstrate improved function with right UE with decreased pain and less difficulty as indicated by QuickDash score of 25% or less by 01/04/2017   Baseline quick Dash 50% (0 = no self perceived disabiltiy)   Status New     PT LONG TERM GOAL #2   Title Patient will be able to reach overhead and behind back for personal care activties of hair care and dressing with pain <2/10 and mild to no difficulty by 01/17/2017    Baseline unable to perform hair care or reach behind back due to pain   Status New     PT LONG TERM GOAL #3   Title Patient will be independent with home program for pain control, exercise and appropriate progression in order to self manage symptoms and continue to improve once discharged from physical therapy by 01/17/2017   Baseline limited knowledge of appropriate pain control strategies, progression of exercises    Status New               Plan - 12/13/16 1006    Clinical Impression Statement Patient is progressing well towards goals with decreasing intensity and frequency of right shoulder pain. She continues with limited AROM to  <100 degrees due to pain ans weakness which limits functional use of right UE. She will benefit from additional physical therapy intervention to achieve goals.    Rehab Potential Good   PT Frequency 2x / week   PT Duration 6 weeks   PT Treatment/Interventions Cryotherapy;Moist Heat;Ultrasound;Patient/family education;Neuromuscular re-education;Therapeutic exercise;Manual techniques   PT Next Visit Plan manual  therapy, modalities for pain control, reduction of spasms, therapeutic exercise for ROM, strength    PT Home Exercise Plan positioning, self massage, scapular retraction, AAROM right UE      Patient will benefit from skilled therapeutic intervention in order to improve the following deficits and impairments:  Decreased strength, Pain, Impaired perceived functional ability, Decreased activity tolerance, Increased muscle spasms, Decreased range of motion  Visit Diagnosis: Right shoulder pain, unspecified chronicity  Muscle weakness (generalized)     Problem List There are no active problems to display for this patient.   Jomarie Longs PT 12/13/2016, 1:52 PM  [place] [facility] [address], [place], [address] Phone: [phone]   Fax:  [phone]  Name: BRENDALYNN DANESI MRN: [id number] Date of Birth: 10-31-1945

## 2016-12-14 DIAGNOSIS — E10649 Type 1 diabetes mellitus with hypoglycemia without coma: Secondary | ICD-10-CM | POA: Diagnosis not present

## 2016-12-16 ENCOUNTER — Encounter: Payer: Self-pay | Admitting: Physical Therapy

## 2016-12-16 ENCOUNTER — Ambulatory Visit: Payer: PPO | Attending: Surgery | Admitting: Physical Therapy

## 2016-12-16 DIAGNOSIS — E038 Other specified hypothyroidism: Secondary | ICD-10-CM | POA: Diagnosis not present

## 2016-12-16 DIAGNOSIS — E785 Hyperlipidemia, unspecified: Secondary | ICD-10-CM | POA: Diagnosis not present

## 2016-12-16 DIAGNOSIS — M6281 Muscle weakness (generalized): Secondary | ICD-10-CM | POA: Diagnosis not present

## 2016-12-16 DIAGNOSIS — Z4681 Encounter for fitting and adjustment of insulin pump: Secondary | ICD-10-CM | POA: Diagnosis not present

## 2016-12-16 DIAGNOSIS — E10649 Type 1 diabetes mellitus with hypoglycemia without coma: Secondary | ICD-10-CM | POA: Diagnosis not present

## 2016-12-16 DIAGNOSIS — D510 Vitamin B12 deficiency anemia due to intrinsic factor deficiency: Secondary | ICD-10-CM | POA: Diagnosis not present

## 2016-12-16 DIAGNOSIS — M25511 Pain in right shoulder: Secondary | ICD-10-CM | POA: Diagnosis not present

## 2016-12-16 DIAGNOSIS — E1042 Type 1 diabetes mellitus with diabetic polyneuropathy: Secondary | ICD-10-CM | POA: Diagnosis not present

## 2016-12-16 DIAGNOSIS — E063 Autoimmune thyroiditis: Secondary | ICD-10-CM | POA: Diagnosis not present

## 2016-12-16 NOTE — Therapy (Signed)
[facility] [address] [place], [address] Phone: [phone]   Fax:  [phone]  Physical Therapy Treatment  Patient Details  Name: Christina Chen MRN: [id number] Date of Birth: 07/21/1946 Referring Provider: Milagros Evener MD  Encounter Date: 12/16/2016      PT End of Session - 12/16/16 1129    Visit Number 4   Number of Visits 12   Date for PT Re-Evaluation 01/17/17   Authorization Type 4   Authorization Time Period 10 (G code)   PT Start Time 1118   PT Stop Time 1215   PT Time Calculation (min) 57 min   Activity Tolerance Patient tolerated treatment well;Patient limited by pain   Behavior During Therapy [facility] for tasks assessed/performed      Past Medical History:  Diagnosis Date  . Arthritis   . Diabetes mellitus without complication (HCC)    Type I insulin pump    Past Surgical History:  Procedure Laterality Date  . APPENDECTOMY      There were no vitals filed for this visit.      Subjective Assessment - 12/16/16 1123    Subjective Patient reports she is improving with decreasing pain in right shoulder and able to raise arm above shoulder level with less difficulty and pain. She continues with intermittment pain into right upper arm.   Limitations Lifting;House hold activities;Other (comment)   Diagnostic tests MRI right shoulder: results in chart: rotator cuff tendinopathy most notable in supraspinatus tendon, moderately severe arthritis AC joint   Patient Stated Goals decrease pain in right shoulder to be able to raise arm above shoulder level reach for personal care and household chores, driving   Currently in Pain? Yes   Pain Score 1    Pain Location Shoulder   Pain Orientation Right   Pain Descriptors / Indicators Aching;Sore   Pain Type Chronic pain   Pain Onset More than a month ago   Pain Frequency Intermittent        Objective:  AROM: right shoulder flexion seated  0-130 AAROM supine lying 140 without pain Palpation: mild spasms right upper trapezius and mild tenderness, thickness anterior aspect of right shoulder pre treatment    Treatment: Manual therapy: STM performed to right shoulder anterior aspect over tender areas, right upper trapezius with patient seated prior to exercises:for spasm reduction and pain control  Therapeutic exercise: patient performed exercises with verbal, tactile cues and demonstration of therapist: Standing at door performed scapular retraction with VC x 10 reps with improved posture, decreased forward shoulder right and decreased forward head posture Standing at wall: performed scaption with demonstration/guidance of therapist x 10 reps both UE's, slow controlled motion with towels behind head to correct alignment Supine lying:AAROM with assistance of therapist to 140 degrees flexion without increased pain Rhythmic stabilization with right UE @ 90 degrees elevation 3 x 10 reps with mild resistance and VC to stabilize; ER/IR in neutral 3 x 10 Side lying left:  ER right shoulder with right arm supported on towel  x 10 reps Scapular control exercises for elevation/depression and protraction/retraction right shoulder x 10 each Re assessed use of pulleys for ROM exercises with VC for correct alignment of shoulders  Modalities: Ultrasound pulsed 50% 1MHz 1.2 w/cm2 x 10 min performed to right shoulder superior and anterior aspect with patient seated, right UE supported on pillow: goal: pain Moist heat applied to right shoulder at end of session following treatment x 10 min.(no  charge): goal: pain control:  no adverse skin reactions noted  Patient response to treatment: Patient demonstrated improved motor control right UE with all exercises with moderate VC and assistance of therapist. Patient able to raise right UE above shoulder level with mild to no pain following treatment.          PT Education - 12/16/16 1127     Education provided Yes   Education Details HEP: continue with exercises as instructed/ add scaption at wall   Person(s) Educated Patient   Methods Explanation;Demonstration;Verbal cues   Comprehension Verbalized understanding;Returned demonstration;Verbal cues required             PT Long Term Goals - 12/06/16 0910      PT LONG TERM GOAL #1   Title Patient will demonstrate improved function with right UE with decreased pain and less difficulty as indicated by QuickDash score of 25% or less by 01/04/2017   Baseline quick Dash 50% (0 = no self perceived disabiltiy)   Status New     PT LONG TERM GOAL #2   Title Patient will be able to reach overhead and behind back for personal care activties of hair care and dressing with pain <2/10 and mild to no difficulty by 01/17/2017    Baseline unable to perform hair care or reach behind back due to pain   Status New     PT LONG TERM GOAL #3   Title Patient will be independent with home program for pain control, exercise and appropriate progression in order to self manage symptoms and continue to improve once discharged from physical therapy by 01/17/2017   Baseline limited knowledge of appropriate pain control strategies, progression of exercises    Status New               Plan - 12/16/16 1215    Clinical Impression Statement Patientis progressing well towards goals with improved AROM to 130 degrees with decreased pain in right shoulder. She continues with decreased strength in right UE and limitations with functional use of right UE and will benefit from additional physical therapy intervention to achieve goals.    Rehab Potential Good   PT Frequency 2x / week   PT Duration 6 weeks   PT Treatment/Interventions Cryotherapy;Moist Heat;Ultrasound;Patient/family education;Neuromuscular re-education;Therapeutic exercise;Manual techniques   PT Next Visit Plan manual therapy, modalities for pain control, reduction of spasms, therapeutic  exercise for ROM, strength    PT Home Exercise Plan positioning, self massage, scapular retraction, AAROM right UE      Patient will benefit from skilled therapeutic intervention in order to improve the following deficits and impairments:  Decreased strength, Pain, Impaired perceived functional ability, Decreased activity tolerance, Increased muscle spasms, Decreased range of motion  Visit Diagnosis: Right shoulder pain, unspecified chronicity  Muscle weakness (generalized)     Problem List There are no active problems to display for this patient.   Aldona Lento 12/16/2016, 4:24 PM  [facility] [address], [place], [address] Phone: [phone]   Fax:  [phone]  Name: Christina Chen MRN: [id number] Date of Birth: [birth year]

## 2016-12-17 DIAGNOSIS — E1042 Type 1 diabetes mellitus with diabetic polyneuropathy: Secondary | ICD-10-CM | POA: Diagnosis not present

## 2016-12-22 ENCOUNTER — Encounter: Payer: Self-pay | Admitting: Physical Therapy

## 2016-12-22 ENCOUNTER — Ambulatory Visit: Payer: PPO | Admitting: Physical Therapy

## 2016-12-22 DIAGNOSIS — M25511 Pain in right shoulder: Secondary | ICD-10-CM

## 2016-12-22 DIAGNOSIS — M6281 Muscle weakness (generalized): Secondary | ICD-10-CM

## 2016-12-22 NOTE — Therapy (Signed)
[facility] [address] [place], [address] Phone: [phone]   Fax:  [phone]  Physical Therapy Treatment  Patient Details  Name: Christina Chen MRN: [id number] Date of Birth: 04/03/1946 Referring Provider: Milagros Evener MD  Encounter Date: 12/22/2016      PT End of Session - 12/22/16 1632    Visit Number 5   Number of Visits 12   Date for PT Re-Evaluation 01/17/17   Authorization Type 5   Authorization Time Period 10 (G code)   PT Start Time 1629   PT Stop Time 1708   PT Time Calculation (min) 39 min   Activity Tolerance Patient tolerated treatment well;Patient limited by pain   Behavior During Therapy [facility] for tasks assessed/performed      Past Medical History:  Diagnosis Date  . Arthritis   . Diabetes mellitus without complication (HCC)    Type I insulin pump    Past Surgical History:  Procedure Laterality Date  . APPENDECTOMY      There were no vitals filed for this visit.      Subjective Assessment - 12/22/16 1630    Subjective Patieint reports she is still seeing improvement with decreasing paiin in right shoulder and only intermittent symptoms in right upper arm.    Limitations Lifting;House hold activities;Other (comment)   Diagnostic tests MRI right shoulder: results in chart: rotator cuff tendinopathy most notable in supraspinatus tendon, moderately severe arthritis AC joint   Patient Stated Goals decrease pain in right shoulder to be able to raise arm above shoulder level reach for personal care and household chores, driving   Currently in Pain? No/denies      Objective:  AROM: right shoulder flexion seated 0-130; limited IR to rear pocket; decreased shoulder abduction to 60 degrees with pain AAROM supine lying140 without pain Palpation: mild spasms right upper trapezius and mild tenderness, decreased thickness anterior aspect of right shoulder     Treatment: Manual  therapy: STM performed to right shoulder anterior aspect over tender areas, right upper trapezius with patient seated prior to exercises:for spasm reduction and pain control  Therapeutic exercise: patient performed exercises with verbal, tactile cues and demonstration of therapist: Standing at door performed scapular retraction with VC x 10 reps with improved posture, decreased forward shoulder right and decreased forward head posture Standing at wall: performed scaption with demonstration/guidance of therapist x 10 reps both UE's, slow controlled motion with towels behind head to correct alignment Supine lying:AAROM with assistance of therapist to 140 degrees flexion without increased pain Rhythmic stabilization with right UE @ 90 degrees elevation 3 x 10 reps with mild resistance and VC to stabilize; ER/IR in neutral 3 x 10 Side lying left:  ER right shoulder with right arm supported on towel  x 10 reps Scapular control exercises for  protraction/retraction right shoulder x 10 each  Modalities: Moist heat applied to right shoulder at end of session following treatment x 10 min.(no charge): goal: pain control:  no adverse skin reactions noted  Patient response to treatment: Patient demonstrated improved motor control with all exercises with moderate VC and assistance of therapist. She improved right shoulder abduction to >90 degrees without pain following STM, exercises.           PT Education - 12/22/16 1710    Education provided Yes   Education Details HEP: conitnue with exercises for ROM, strengthening with good alignment   Person(s) Educated Patient   Methods Explanation;Demonstration;Verbal  cues   Comprehension Verbalized understanding;Returned demonstration;Verbal cues required             PT Long Term Goals - 12/06/16 0910      PT LONG TERM GOAL #1   Title Patient will demonstrate improved function with right UE with decreased pain and less difficulty as  indicated by QuickDash score of 25% or less by 01/04/2017   Baseline quick Dash 50% (0 = no self perceived disabiltiy)   Status New     PT LONG TERM GOAL #2   Title Patient will be able to reach overhead and behind back for personal care activties of hair care and dressing with pain <2/10 and mild to no difficulty by 01/17/2017    Baseline unable to perform hair care or reach behind back due to pain   Status New     PT LONG TERM GOAL #3   Title Patient will be independent with home program for pain control, exercise and appropriate progression in order to self manage symptoms and continue to improve once discharged from physical therapy by 01/17/2017   Baseline limited knowledge of appropriate pain control strategies, progression of exercises    Status New               Plan - 12/22/16 1633    Clinical Impression Statement Patient is progressing well with improving functional use right UE. She continues with limitations of decresaed ROM and strength right UE and will benefit from conitnued physical therapy intervention to achieve goals.    Rehab Potential Good   PT Frequency 2x / week   PT Duration 6 weeks   PT Treatment/Interventions Cryotherapy;Moist Heat;Ultrasound;Patient/family education;Neuromuscular re-education;Therapeutic exercise;Manual techniques   PT Next Visit Plan manual therapy, modalities for pain control, reduction of spasms, therapeutic exercise for ROM, strength    PT Home Exercise Plan positioning, self massage, scapular retraction, AAROM right UE      Patient will benefit from skilled therapeutic intervention in order to improve the following deficits and impairments:  Decreased strength, Pain, Impaired perceived functional ability, Decreased activity tolerance, Increased muscle spasms, Decreased range of motion  Visit Diagnosis: Right shoulder pain, unspecified chronicity  Muscle weakness (generalized)     Problem List There are no active problems to  display for this patient.   Jomarie Longs PT 12/23/2016, 10:51 PM  [facility] [address] [place], [address] Phone: [phone]   Fax:  [phone]  Name: BLAKELEE ALLINGTON MRN: [id number] Date of Birth: 05-02-1946

## 2016-12-24 ENCOUNTER — Ambulatory Visit: Payer: PPO | Admitting: Physical Therapy

## 2016-12-24 ENCOUNTER — Encounter: Payer: Self-pay | Admitting: Physical Therapy

## 2016-12-24 DIAGNOSIS — M6281 Muscle weakness (generalized): Secondary | ICD-10-CM

## 2016-12-24 DIAGNOSIS — M25511 Pain in right shoulder: Secondary | ICD-10-CM

## 2016-12-24 NOTE — Therapy (Signed)
[facility] [address], [place], [address] Phone: [phone]   Fax:  [phone]  Physical Therapy Treatment  Patient Details  Name: Christina Chen MRN: [id number] Date of Birth: November 12, 1945 Referring Provider: Milagros Evener MD  Encounter Date: 12/24/2016      PT End of Session - 12/24/16 0950    Visit Number 6   Number of Visits 12   Date for PT Re-Evaluation 01/17/17   Authorization Type 6   Authorization Time Period 10 (G code)   PT Start Time 0905   PT Stop Time 0949   PT Time Calculation (min) 44 min   Activity Tolerance Patient tolerated treatment well;Patient limited by pain   Behavior During Therapy [facility] for tasks assessed/performed      Past Medical History:  Diagnosis Date  . Arthritis   . Diabetes mellitus without complication (HCC)    Type I insulin pump    Past Surgical History:  Procedure Laterality Date  . APPENDECTOMY      There were no vitals filed for this visit.      Subjective Assessment - 12/24/16 0908    Subjective Patient reports she is still seeing improvement with decreasing paiin in right shoulder and only intermittent symptoms in right upper arm. Has had pain only once since previous session. She is discussing whether she can decrease to 1x/week because she is a great deal improved.   Limitations Lifting;House hold activities;Other (comment)   Diagnostic tests MRI right shoulder: results in chart: rotator cuff tendinopathy most notable in supraspinatus tendon, moderately severe arthritis AC joint   Patient Stated Goals decrease pain in right shoulder to be able to raise arm above shoulder level reach for personal care and household chores, driving   Currently in Pain? No/denies          Objective:  AROM: right shoulder flexion seated 0-140; limited IR to rear pocket; decreased shoulder abduction to 80 degrees with pain; decreased pain as compared to previous  session AAROM supine lying150 without pain following STM Palpation: mild spasms right upper trapezius and mild tenderness,+ spasms posterior aspect right shoulder, lats. and subscapulars muscles and pectoral muscles    Treatment: Manual therapy: STM performed to right shoulder anterior aspect over tender areas, right upper trapezius, pectoral, subscapularis muscles with patient supine prior toexercises:for spasm reduction and pain control  Therapeutic exercise: patient performed exercises with verbal, tactile cues and demonstration of therapist: Standing at wall: performed scaption with demonstration/guidance of therapist x 15 reps both UE's, snow angels through partial ROM x 15 slow controlled motion with towels behind head to correct alignment scapular retraction with green resistive tubing performed high/low rows with demonstration and VC for correct technique, patient performed 10 reps each  Supine lying:AAROM with assistance of therapist to 150 degrees flexion without increased pain Rhythmic stabilization with right UE @ 90 degrees elevation 2 x 10 reps with mild resistance and VC to stabilize; ER in neutral 2 x 10 with mild to moderate manual resistance given Side lying left:  ER right shoulder with right arm supported on towel x 10 reps with 2# weight Scapular control exercises for  protraction/retraction right shoulder x 10 each  Modalities: Moist heat applied to right shoulder at end of session following treatment x 10 min.(no charge): goal: pain control: no adverse skin reactions noted  Patient response to treatment: Patient demonstrated good technique and improved motor control with repetition and minimal VC of therapist. Patient  able to perform right shoulder abduction to 90 degrees + with minimal difficulty following STM, exercise.          PT Education - 12/24/16 0945    Education provided Yes   Education Details HEP: side lying ER, re assessed resistive  scapular retracion with green resistive tubing   Person(s) Educated Patient   Methods Explanation;Demonstration;Verbal cues;Handout   Comprehension Verbalized understanding;Returned demonstration;Verbal cues required             PT Long Term Goals - 12/06/16 0910      PT LONG TERM GOAL #1   Title Patient will demonstrate improved function with right UE with decreased pain and less difficulty as indicated by QuickDash score of 25% or less by 01/04/2017   Baseline quick Dash 50% (0 = no self perceived disabiltiy)   Status New     PT LONG TERM GOAL #2   Title Patient will be able to reach overhead and behind back for personal care activties of hair care and dressing with pain <2/10 and mild to no difficulty by 01/17/2017    Baseline unable to perform hair care or reach behind back due to pain   Status New     PT LONG TERM GOAL #3   Title Patient will be independent with home program for pain control, exercise and appropriate progression in order to self manage symptoms and continue to improve once discharged from physical therapy by 01/17/2017   Baseline limited knowledge of appropriate pain control strategies, progression of exercises    Status New               Plan - 12/24/16 1006    Clinical Impression Statement Patient is progressing well with improving AROM with decreased pain which allows her to perform activities at home with less difficulty. She continues with limitations of ROM, decreased strength and decreased knowledge of progression of exercises with will therefore benefit from additional physical therapy intervention 1x/week.   Rehab Potential Good   PT Frequency 2x / week   PT Duration 6 weeks   PT Treatment/Interventions Cryotherapy;Moist Heat;Ultrasound;Patient/family education;Neuromuscular re-education;Therapeutic exercise;Manual techniques   PT Next Visit Plan manual therapy, modalities for pain control, reduction of spasms, therapeutic exercise for ROM,  strength    PT Home Exercise Plan positioning, self massage, scapular retraction, AAROM right UE      Patient will benefit from skilled therapeutic intervention in order to improve the following deficits and impairments:  Decreased strength, Pain, Impaired perceived functional ability, Decreased activity tolerance, Increased muscle spasms, Decreased range of motion  Visit Diagnosis: Right shoulder pain, unspecified chronicity  Muscle weakness (generalized)     Problem List There are no active problems to display for this patient.   Jomarie Longs PT 12/24/2016, 7:39 PM  [facility] [address] [place], [address] Phone: [phone]   Fax:  [phone]  Name: Christina Chen MRN: [id number] Date of Birth: [birth year]

## 2016-12-27 ENCOUNTER — Ambulatory Visit: Payer: PPO | Admitting: Physical Therapy

## 2016-12-29 ENCOUNTER — Encounter: Payer: Self-pay | Admitting: Physical Therapy

## 2016-12-29 ENCOUNTER — Ambulatory Visit: Payer: PPO | Admitting: Physical Therapy

## 2016-12-29 DIAGNOSIS — M25511 Pain in right shoulder: Secondary | ICD-10-CM | POA: Diagnosis not present

## 2016-12-29 DIAGNOSIS — M6281 Muscle weakness (generalized): Secondary | ICD-10-CM

## 2016-12-29 NOTE — Therapy (Signed)
[place] [facility] [address], [place], [address] Phone: [phone]   Fax:  [phone]  Physical Therapy Treatment/Discharge Summary  Patient Details  Name: Christina Chen MRN: [id number] Date of Birth: Sep 10, 1946 Referring Provider: Milagros Evener MD  Encounter Date: 12/29/2016   Patient began physical therapy 12/06/2016 and has attended 7 sessions through 12/29/2016 with goals achieved and is independent with self management of symptoms and home exercise program.      PT End of Session - 12/29/16 1625    Visit Number 7   Number of Visits 12   Date for PT Re-Evaluation 01/17/17   Authorization Type 7   Authorization Time Period 10 (G code)   PT Start Time 1623   PT Stop Time 1653   PT Time Calculation (min) 30 min   Activity Tolerance Patient tolerated treatment well;Patient limited by pain   Behavior During Therapy [facility] for tasks assessed/performed      Past Medical History:  Diagnosis Date  . Arthritis   . Diabetes mellitus without complication (HCC)    Type I insulin pump    Past Surgical History:  Procedure Laterality Date  . APPENDECTOMY      There were no vitals filed for this visit.      Subjective Assessment - 12/29/16 1624    Subjective Patient reports no pain in right shoulder/upper arm today.    Limitations Lifting;House hold activities;Other (comment)   Diagnostic tests MRI right shoulder: results in chart: rotator cuff tendinopathy most notable in supraspinatus tendon, moderately severe arthritis AC joint   Patient Stated Goals decrease pain in right shoulder to be able to raise arm above shoulder level reach for personal care and household chores, driving   Currently in Pain? No/denies      Objective; Observation;  Posture: initially: right shoulder elevated as compared to left shoulder with spasms in upper trapezius Currently: equal heights of shoulders   AROM:  Right UE;  initially: shoulder flexion 120 currently 0-140, IR to L5, initially ER WFL with stiffness, pain; currently ER [facility] without pain Left UE: shoulder flexion 140, IR to T12, ER WNL no pain  PROM: Initially: Right UE: supine lying: shoulder forward elevation to 120 degrees with pain limiting further motion, ER 0-60 with pain, ER 0-60 with pain PROM: Right UE currently shoulder flexion to 150 degrees without pain; full ER/IR without pain Palpation: mild spasms right upper trapezius and mild tenderness,+ spasms posterior aspect right shoulder, lats. and subscapulars muscles and pectoral muscles  Outcome Measure: Quick Dash 15% (initially was 50%) indicating significant change and improvement  Treatment: Manual therapy: STM performed to right shoulder anterior aspect over tender areas, right upper trapezius, pectoral, subscapularis muscles with patient supine prior toexercises:for spasm reduction and pain control  Therapeutic exercise: patient performed exercises with verbal, tactile cues and demonstration of therapist: Standing at wall: performed scaption  x 15 reps both UE's, snow angels through partial ROM x 15 slow controlled motion  scapular retraction with green resistive tubing performed high/low rows with demonstration and VC for correct technique, patient performed 10 reps each  Supine lying:AAROM with assistance of therapist to 150 degrees flexion without increased pain Rhythmic stabilization with right UE @ 90 degrees elevation 2 x 10 reps with mild resistance and VC to stabilize; ER in neutral 2 x 10 with mild to moderate manual resistance given Side lying left:  ER right shoulder with right arm supported on towel x 10 reps  with 2# weight Standing:  Resistive band for scapular retraction and extension to hips performed following demonstration and instruction x 15 reps each   Patient response to treatment: Patient demonstrated good technique with all exercises without increased  pain and with minimal VC for correct alignment. She verbalized good understanding of home program.       PT Education - January 20, 2017 1630    Education provided Yes   Education Details HEP; re assessed home program   Person(s) Educated Patient   Methods Explanation   Comprehension Verbalized understanding             PT Long Term Goals - Jan 20, 2017 1628      PT LONG TERM GOAL #1   Title Patient will demonstrate improved function with right UE with decreased pain and less difficulty as indicated by QuickDash score of 25% or less by 01/04/2017   Baseline quick Dash 50% (0 = no self perceived disabiltiy)Quick Dash 15%   Status Achieved     PT LONG TERM GOAL #2   Title Patient will be able to reach overhead and behind back for personal care activties of hair care and dressing with pain <2/10 and mild to no difficulty by 01/17/2017    Baseline unable to perform hair care or reach behind back due to pain able to reach overhead for personal care, hair care with mild difficulty and 2/10 01-20-2017   Status Achieved     PT LONG TERM GOAL #3   Title Patient will be independent with home program for pain control, exercise and appropriate progression in order to self manage symptoms and continue to improve once discharged from physical therapy by 01/17/2017   Baseline limited knowledge of appropriate pain control strategies, progression of exercises: independent with home program/self managment   Status Achieved               Plan - January 20, 2017 1625    Clinical Impression Statement Patient has achieved all goals and is independent with home program for self management of symptoms and exercise. She is ready for discharge from physical therapy at this time.    Rehab Potential Good   PT Frequency 2x / week   PT Duration 6 weeks   PT Treatment/Interventions Cryotherapy;Moist Heat;Ultrasound;Patient/family education;Neuromuscular re-education;Therapeutic exercise;Manual techniques   PT Next Visit  Plan discharge from physical therapy   PT Home Exercise Plan positioning, self massage, scapular retraction, AAROM right UE   Consulted and Agree with Plan of Care Patient      Patient will benefit from skilled therapeutic intervention in order to improve the following deficits and impairments:  Decreased strength, Pain, Impaired perceived functional ability, Decreased activity tolerance, Increased muscle spasms, Decreased range of motion  Visit Diagnosis: Right shoulder pain, unspecified chronicity  Muscle weakness (generalized)       G-Codes - 01-20-17 1655    Functional Assessment Tool Used (Outpatient Only) quickDash, ROM, strength deficits, clinical judgment   Functional Limitation Carrying, moving and handling objects   Carrying, Moving and Handling Objects Goal Status (F1638) At least 1 percent but less than 20 percent impaired, limited or restricted   Carrying, Moving and Handling Objects Discharge Status [id number]) At least 1 percent but less than 20 percent impaired, limited or restricted      Problem List There are no active problems to display for this patient.   Jomarie Longs PT 12/30/2016, 10:33 PM  [place] [facility] [address], [place],  [place] Phone: [phone]   Fax:  [phone]  Name: Christina Chen MRN: [id number] Date of Birth: Feb 24, 1946

## 2017-01-03 ENCOUNTER — Encounter: Payer: PPO | Admitting: Physical Therapy

## 2017-01-05 ENCOUNTER — Ambulatory Visit: Payer: PPO | Admitting: Physical Therapy

## 2017-01-07 DIAGNOSIS — L708 Other acne: Secondary | ICD-10-CM | POA: Diagnosis not present

## 2017-01-07 DIAGNOSIS — L400 Psoriasis vulgaris: Secondary | ICD-10-CM | POA: Diagnosis not present

## 2017-01-10 ENCOUNTER — Encounter: Payer: PPO | Admitting: Physical Therapy

## 2017-01-12 ENCOUNTER — Encounter: Payer: PPO | Admitting: Physical Therapy

## 2017-01-17 DIAGNOSIS — E10649 Type 1 diabetes mellitus with hypoglycemia without coma: Secondary | ICD-10-CM | POA: Diagnosis not present

## 2017-01-17 DIAGNOSIS — E1042 Type 1 diabetes mellitus with diabetic polyneuropathy: Secondary | ICD-10-CM | POA: Diagnosis not present

## 2017-02-16 DIAGNOSIS — E1042 Type 1 diabetes mellitus with diabetic polyneuropathy: Secondary | ICD-10-CM | POA: Diagnosis not present

## 2017-02-16 DIAGNOSIS — E10649 Type 1 diabetes mellitus with hypoglycemia without coma: Secondary | ICD-10-CM | POA: Diagnosis not present

## 2017-02-21 DIAGNOSIS — E10649 Type 1 diabetes mellitus with hypoglycemia without coma: Secondary | ICD-10-CM | POA: Diagnosis not present

## 2017-02-21 DIAGNOSIS — E1042 Type 1 diabetes mellitus with diabetic polyneuropathy: Secondary | ICD-10-CM | POA: Diagnosis not present

## 2017-03-01 DIAGNOSIS — L409 Psoriasis, unspecified: Secondary | ICD-10-CM | POA: Diagnosis not present

## 2017-03-15 DIAGNOSIS — I10 Essential (primary) hypertension: Secondary | ICD-10-CM | POA: Diagnosis not present

## 2017-03-15 DIAGNOSIS — E7800 Pure hypercholesterolemia, unspecified: Secondary | ICD-10-CM | POA: Diagnosis not present

## 2017-03-15 DIAGNOSIS — E559 Vitamin D deficiency, unspecified: Secondary | ICD-10-CM | POA: Diagnosis not present

## 2017-03-15 DIAGNOSIS — E069 Thyroiditis, unspecified: Secondary | ICD-10-CM | POA: Diagnosis not present

## 2017-03-15 DIAGNOSIS — D510 Vitamin B12 deficiency anemia due to intrinsic factor deficiency: Secondary | ICD-10-CM | POA: Diagnosis not present

## 2017-03-15 DIAGNOSIS — E038 Other specified hypothyroidism: Secondary | ICD-10-CM | POA: Diagnosis not present

## 2017-03-22 DIAGNOSIS — E7800 Pure hypercholesterolemia, unspecified: Secondary | ICD-10-CM | POA: Diagnosis not present

## 2017-03-22 DIAGNOSIS — E669 Obesity, unspecified: Secondary | ICD-10-CM | POA: Diagnosis not present

## 2017-03-22 DIAGNOSIS — I10 Essential (primary) hypertension: Secondary | ICD-10-CM | POA: Diagnosis not present

## 2017-03-22 DIAGNOSIS — Z0000 Encounter for general adult medical examination without abnormal findings: Secondary | ICD-10-CM | POA: Diagnosis not present

## 2017-03-29 DIAGNOSIS — E785 Hyperlipidemia, unspecified: Secondary | ICD-10-CM | POA: Diagnosis not present

## 2017-03-29 DIAGNOSIS — E10649 Type 1 diabetes mellitus with hypoglycemia without coma: Secondary | ICD-10-CM | POA: Diagnosis not present

## 2017-03-29 DIAGNOSIS — D510 Vitamin B12 deficiency anemia due to intrinsic factor deficiency: Secondary | ICD-10-CM | POA: Diagnosis not present

## 2017-03-29 DIAGNOSIS — E038 Other specified hypothyroidism: Secondary | ICD-10-CM | POA: Diagnosis not present

## 2017-03-29 DIAGNOSIS — E1042 Type 1 diabetes mellitus with diabetic polyneuropathy: Secondary | ICD-10-CM | POA: Diagnosis not present

## 2017-03-29 DIAGNOSIS — E063 Autoimmune thyroiditis: Secondary | ICD-10-CM | POA: Diagnosis not present

## 2017-04-12 DIAGNOSIS — B372 Candidiasis of skin and nail: Secondary | ICD-10-CM | POA: Diagnosis not present

## 2017-04-12 DIAGNOSIS — W57XXXA Bitten or stung by nonvenomous insect and other nonvenomous arthropods, initial encounter: Secondary | ICD-10-CM | POA: Diagnosis not present

## 2017-04-12 DIAGNOSIS — S30864A Insect bite (nonvenomous) of vagina and vulva, initial encounter: Secondary | ICD-10-CM | POA: Diagnosis not present

## 2017-04-13 DIAGNOSIS — D692 Other nonthrombocytopenic purpura: Secondary | ICD-10-CM | POA: Diagnosis not present

## 2017-04-13 DIAGNOSIS — L400 Psoriasis vulgaris: Secondary | ICD-10-CM | POA: Diagnosis not present

## 2017-04-28 DIAGNOSIS — E1042 Type 1 diabetes mellitus with diabetic polyneuropathy: Secondary | ICD-10-CM | POA: Diagnosis not present

## 2017-04-28 DIAGNOSIS — E10649 Type 1 diabetes mellitus with hypoglycemia without coma: Secondary | ICD-10-CM | POA: Diagnosis not present

## 2017-05-12 ENCOUNTER — Other Ambulatory Visit: Payer: Self-pay | Admitting: Family Medicine

## 2017-05-12 DIAGNOSIS — Z1231 Encounter for screening mammogram for malignant neoplasm of breast: Secondary | ICD-10-CM

## 2017-05-30 DIAGNOSIS — E1042 Type 1 diabetes mellitus with diabetic polyneuropathy: Secondary | ICD-10-CM | POA: Diagnosis not present

## 2017-05-30 DIAGNOSIS — E10649 Type 1 diabetes mellitus with hypoglycemia without coma: Secondary | ICD-10-CM | POA: Diagnosis not present

## 2017-05-31 DIAGNOSIS — E10649 Type 1 diabetes mellitus with hypoglycemia without coma: Secondary | ICD-10-CM | POA: Diagnosis not present

## 2017-05-31 DIAGNOSIS — E1042 Type 1 diabetes mellitus with diabetic polyneuropathy: Secondary | ICD-10-CM | POA: Diagnosis not present

## 2017-06-01 DIAGNOSIS — G5702 Lesion of sciatic nerve, left lower limb: Secondary | ICD-10-CM | POA: Diagnosis not present

## 2017-06-07 DIAGNOSIS — M791 Myalgia: Secondary | ICD-10-CM | POA: Diagnosis not present

## 2017-06-07 DIAGNOSIS — M47816 Spondylosis without myelopathy or radiculopathy, lumbar region: Secondary | ICD-10-CM | POA: Diagnosis not present

## 2017-06-21 DIAGNOSIS — E038 Other specified hypothyroidism: Secondary | ICD-10-CM | POA: Diagnosis not present

## 2017-06-21 DIAGNOSIS — E1042 Type 1 diabetes mellitus with diabetic polyneuropathy: Secondary | ICD-10-CM | POA: Diagnosis not present

## 2017-06-21 DIAGNOSIS — E063 Autoimmune thyroiditis: Secondary | ICD-10-CM | POA: Diagnosis not present

## 2017-06-28 DIAGNOSIS — E10649 Type 1 diabetes mellitus with hypoglycemia without coma: Secondary | ICD-10-CM | POA: Diagnosis not present

## 2017-06-28 DIAGNOSIS — R358 Other polyuria: Secondary | ICD-10-CM | POA: Diagnosis not present

## 2017-06-28 DIAGNOSIS — E063 Autoimmune thyroiditis: Secondary | ICD-10-CM | POA: Diagnosis not present

## 2017-06-28 DIAGNOSIS — E785 Hyperlipidemia, unspecified: Secondary | ICD-10-CM | POA: Diagnosis not present

## 2017-06-28 DIAGNOSIS — E538 Deficiency of other specified B group vitamins: Secondary | ICD-10-CM | POA: Diagnosis not present

## 2017-06-28 DIAGNOSIS — Z4681 Encounter for fitting and adjustment of insulin pump: Secondary | ICD-10-CM | POA: Diagnosis not present

## 2017-06-28 DIAGNOSIS — E038 Other specified hypothyroidism: Secondary | ICD-10-CM | POA: Diagnosis not present

## 2017-06-28 DIAGNOSIS — R350 Frequency of micturition: Secondary | ICD-10-CM | POA: Diagnosis not present

## 2017-06-28 DIAGNOSIS — D510 Vitamin B12 deficiency anemia due to intrinsic factor deficiency: Secondary | ICD-10-CM | POA: Diagnosis not present

## 2017-06-28 DIAGNOSIS — E1042 Type 1 diabetes mellitus with diabetic polyneuropathy: Secondary | ICD-10-CM | POA: Diagnosis not present

## 2017-07-07 DIAGNOSIS — E1042 Type 1 diabetes mellitus with diabetic polyneuropathy: Secondary | ICD-10-CM | POA: Diagnosis not present

## 2017-07-07 DIAGNOSIS — E10649 Type 1 diabetes mellitus with hypoglycemia without coma: Secondary | ICD-10-CM | POA: Diagnosis not present

## 2017-07-20 DIAGNOSIS — L400 Psoriasis vulgaris: Secondary | ICD-10-CM | POA: Diagnosis not present

## 2017-07-29 DIAGNOSIS — Z5181 Encounter for therapeutic drug level monitoring: Secondary | ICD-10-CM | POA: Diagnosis not present

## 2017-07-29 DIAGNOSIS — L400 Psoriasis vulgaris: Secondary | ICD-10-CM | POA: Diagnosis not present

## 2017-08-09 DIAGNOSIS — E10649 Type 1 diabetes mellitus with hypoglycemia without coma: Secondary | ICD-10-CM | POA: Diagnosis not present

## 2017-08-09 DIAGNOSIS — E1042 Type 1 diabetes mellitus with diabetic polyneuropathy: Secondary | ICD-10-CM | POA: Diagnosis not present

## 2017-08-16 ENCOUNTER — Ambulatory Visit
Admission: RE | Admit: 2017-08-16 | Discharge: 2017-08-16 | Disposition: A | Discharge status: Home / Self Care | Payer: PPO | Source: Ambulatory Visit | Attending: Family Medicine | Admitting: Family Medicine

## 2017-08-16 DIAGNOSIS — Z1231 Encounter for screening mammogram for malignant neoplasm of breast: Secondary | ICD-10-CM | POA: Diagnosis not present

## 2017-08-31 DIAGNOSIS — E1042 Type 1 diabetes mellitus with diabetic polyneuropathy: Secondary | ICD-10-CM | POA: Diagnosis not present

## 2017-08-31 DIAGNOSIS — E10649 Type 1 diabetes mellitus with hypoglycemia without coma: Secondary | ICD-10-CM | POA: Diagnosis not present

## 2017-09-20 DIAGNOSIS — E7800 Pure hypercholesterolemia, unspecified: Secondary | ICD-10-CM | POA: Diagnosis not present

## 2017-09-20 DIAGNOSIS — I10 Essential (primary) hypertension: Secondary | ICD-10-CM | POA: Diagnosis not present

## 2017-09-21 DIAGNOSIS — E1042 Type 1 diabetes mellitus with diabetic polyneuropathy: Secondary | ICD-10-CM | POA: Diagnosis not present

## 2017-09-21 DIAGNOSIS — E10649 Type 1 diabetes mellitus with hypoglycemia without coma: Secondary | ICD-10-CM | POA: Diagnosis not present

## 2017-09-29 DIAGNOSIS — I10 Essential (primary) hypertension: Secondary | ICD-10-CM | POA: Diagnosis not present

## 2017-09-29 DIAGNOSIS — E669 Obesity, unspecified: Secondary | ICD-10-CM | POA: Diagnosis not present

## 2017-09-29 DIAGNOSIS — E7800 Pure hypercholesterolemia, unspecified: Secondary | ICD-10-CM | POA: Diagnosis not present

## 2017-09-30 DIAGNOSIS — E1042 Type 1 diabetes mellitus with diabetic polyneuropathy: Secondary | ICD-10-CM | POA: Diagnosis not present

## 2017-09-30 DIAGNOSIS — E10649 Type 1 diabetes mellitus with hypoglycemia without coma: Secondary | ICD-10-CM | POA: Diagnosis not present

## 2017-09-30 DIAGNOSIS — Z4681 Encounter for fitting and adjustment of insulin pump: Secondary | ICD-10-CM | POA: Diagnosis not present

## 2017-09-30 DIAGNOSIS — E785 Hyperlipidemia, unspecified: Secondary | ICD-10-CM | POA: Diagnosis not present

## 2017-09-30 DIAGNOSIS — E538 Deficiency of other specified B group vitamins: Secondary | ICD-10-CM | POA: Diagnosis not present

## 2017-09-30 DIAGNOSIS — D510 Vitamin B12 deficiency anemia due to intrinsic factor deficiency: Secondary | ICD-10-CM | POA: Diagnosis not present

## 2017-09-30 DIAGNOSIS — E038 Other specified hypothyroidism: Secondary | ICD-10-CM | POA: Diagnosis not present

## 2017-09-30 DIAGNOSIS — E063 Autoimmune thyroiditis: Secondary | ICD-10-CM | POA: Diagnosis not present

## 2017-10-24 DIAGNOSIS — E1042 Type 1 diabetes mellitus with diabetic polyneuropathy: Secondary | ICD-10-CM | POA: Diagnosis not present

## 2017-10-24 DIAGNOSIS — E10649 Type 1 diabetes mellitus with hypoglycemia without coma: Secondary | ICD-10-CM | POA: Diagnosis not present

## 2017-11-02 DIAGNOSIS — H2511 Age-related nuclear cataract, right eye: Secondary | ICD-10-CM | POA: Diagnosis not present

## 2017-11-24 DIAGNOSIS — E1042 Type 1 diabetes mellitus with diabetic polyneuropathy: Secondary | ICD-10-CM | POA: Diagnosis not present

## 2017-11-24 DIAGNOSIS — E10649 Type 1 diabetes mellitus with hypoglycemia without coma: Secondary | ICD-10-CM | POA: Diagnosis not present

## 2017-12-05 DIAGNOSIS — E1042 Type 1 diabetes mellitus with diabetic polyneuropathy: Secondary | ICD-10-CM | POA: Diagnosis not present

## 2017-12-05 DIAGNOSIS — H01003 Unspecified blepharitis right eye, unspecified eyelid: Secondary | ICD-10-CM | POA: Diagnosis not present

## 2017-12-05 DIAGNOSIS — E10649 Type 1 diabetes mellitus with hypoglycemia without coma: Secondary | ICD-10-CM | POA: Diagnosis not present

## 2017-12-19 IMAGING — MG 2D DIGITAL SCREENING BILATERAL MAMMOGRAM WITH CAD AND ADJUNCT TO
8 of 12 series · 8 of 28 positions shown · non-contrast
Comparison: Previous exam(s).

CLINICAL DATA: Screening.

EXAM:
2D DIGITAL SCREENING BILATERAL MAMMOGRAM WITH CAD AND ADJUNCT TOMO

[L CC synth-2D]
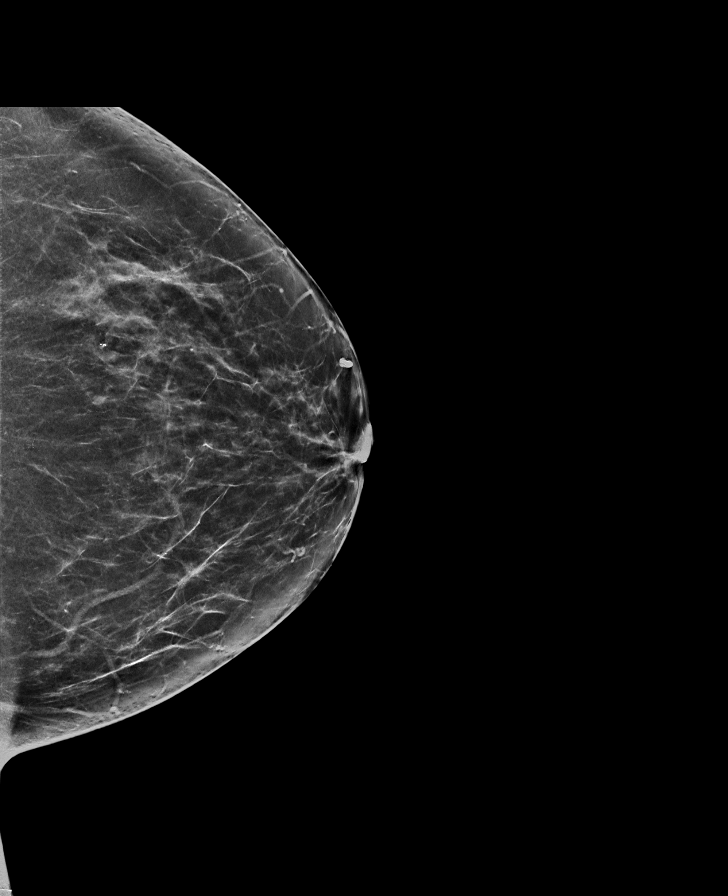

[L MLO]
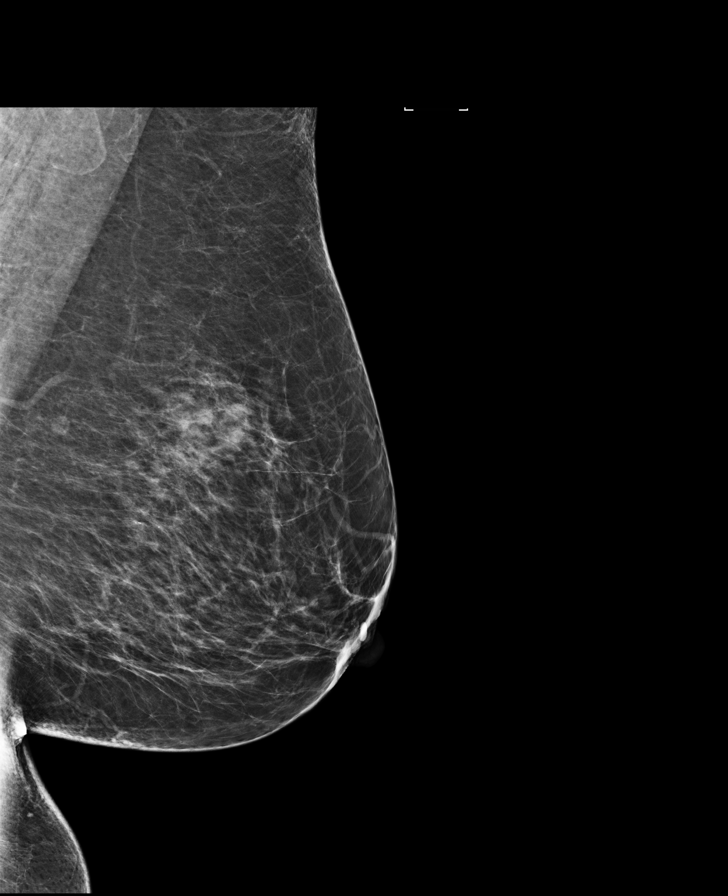

[R MLO synth-2D]
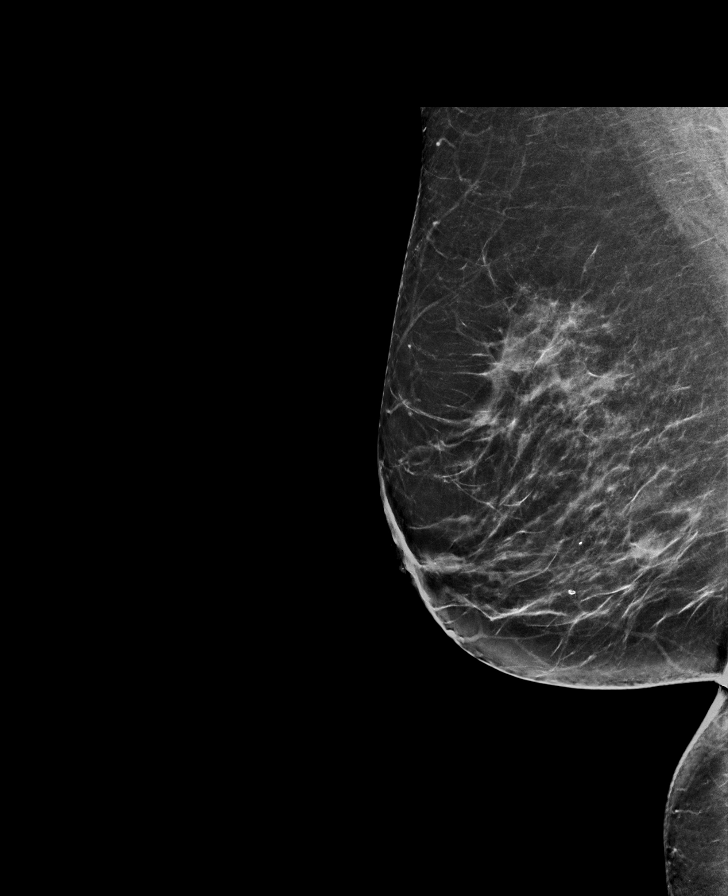

[L CC]
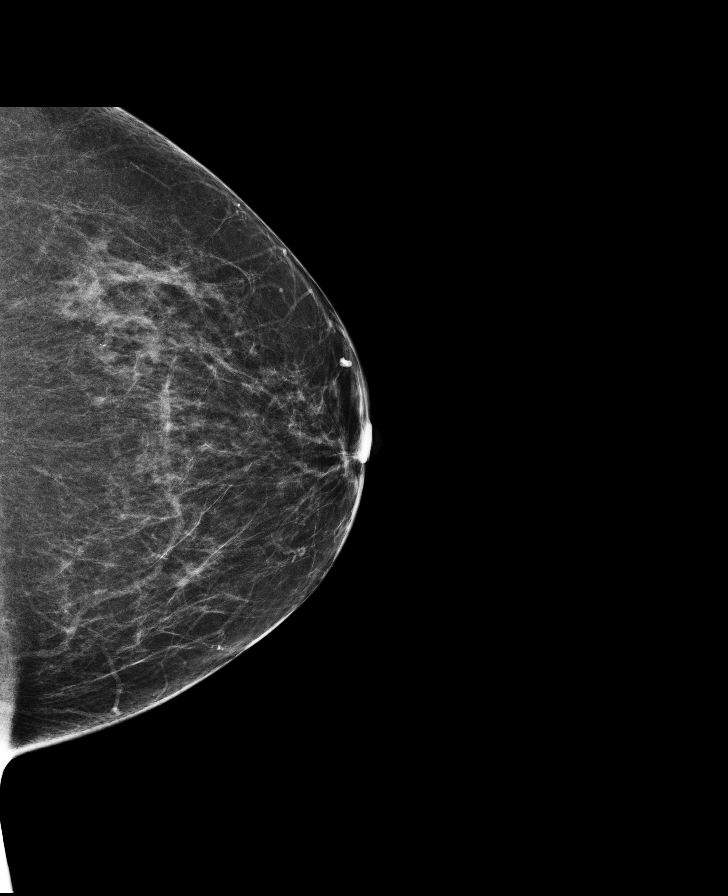

[R CC synth-2D]
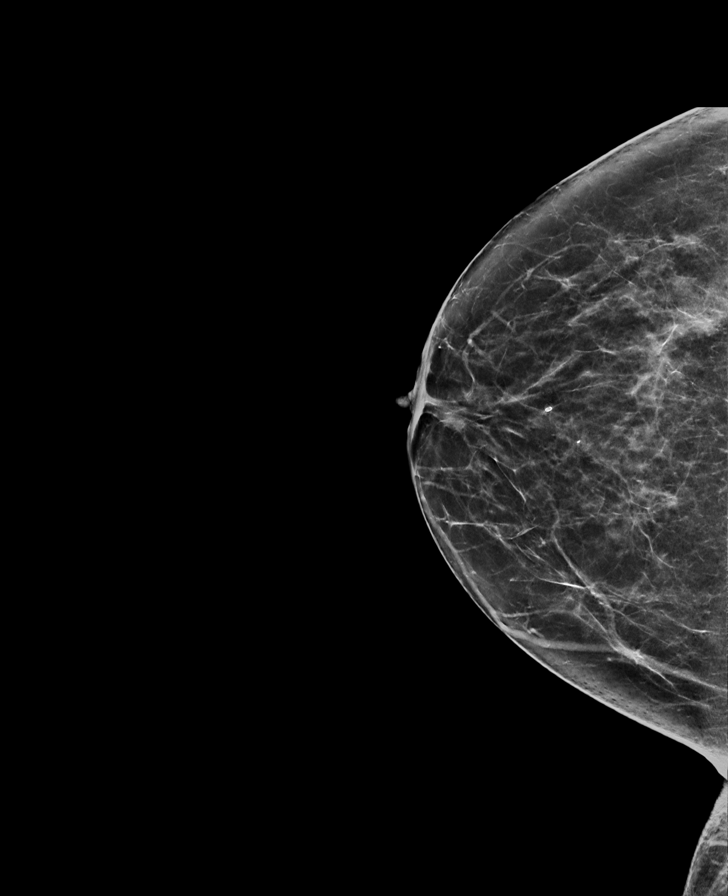

[R CC]
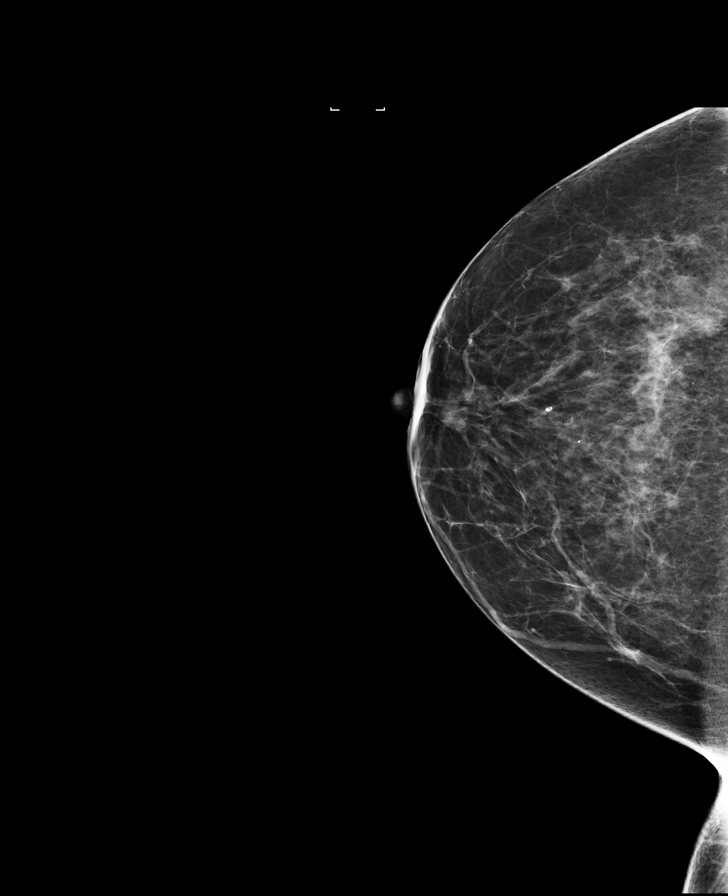

[L MLO synth-2D]
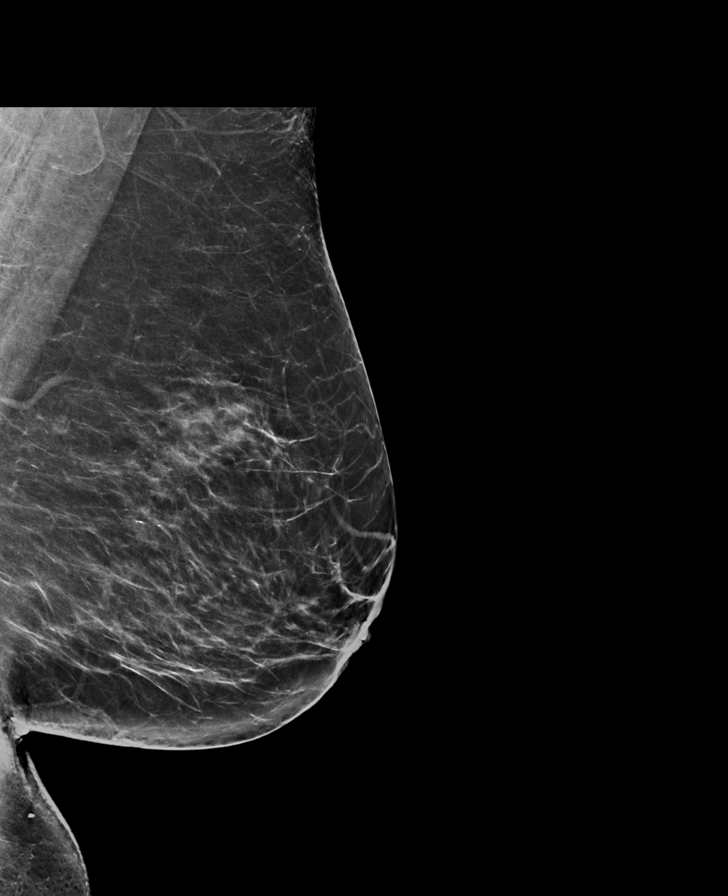

[R MLO]
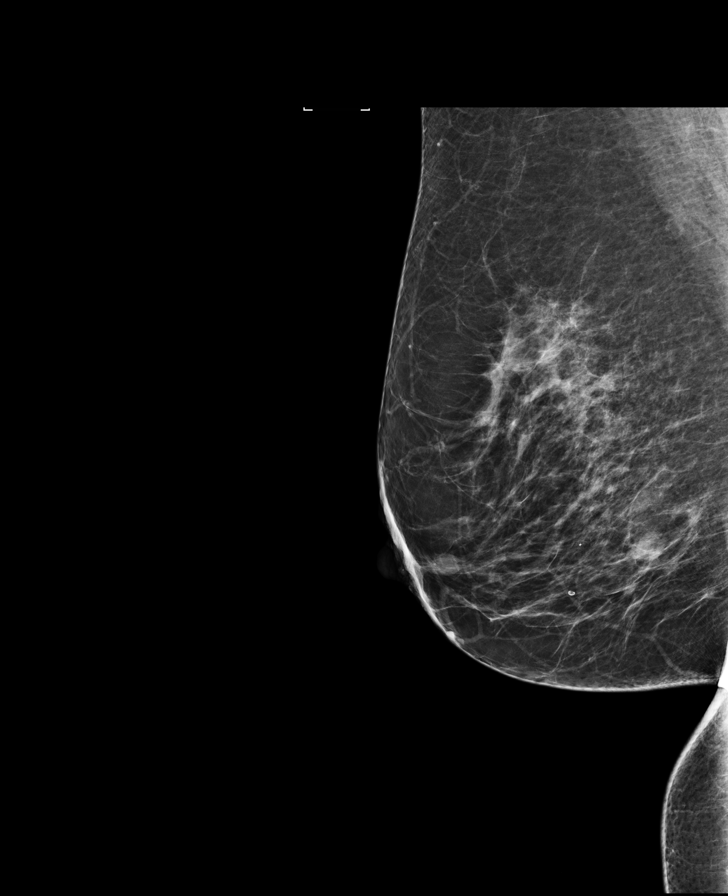

[8 of 28 positions shown; findings below may reference images not displayed]

ACR Breast Density Category b: There are scattered areas of
fibroglandular density.
FINDINGS: There are no findings suspicious for malignancy. Images were
processed with CAD.
IMPRESSION: No mammographic evidence of malignancy. A result letter of this
screening mammogram will be mailed directly to the patient.

RECOMMENDATION:
Screening mammogram in one year. (Code:97-6-RS4)

BI-RADS CATEGORY  1: Negative.

## 2017-12-21 DIAGNOSIS — L400 Psoriasis vulgaris: Secondary | ICD-10-CM | POA: Diagnosis not present

## 2017-12-28 DIAGNOSIS — Z5181 Encounter for therapeutic drug level monitoring: Secondary | ICD-10-CM | POA: Diagnosis not present

## 2017-12-28 DIAGNOSIS — L400 Psoriasis vulgaris: Secondary | ICD-10-CM | POA: Diagnosis not present

## 2017-12-29 ENCOUNTER — Other Ambulatory Visit: Payer: Self-pay | Admitting: Family Medicine

## 2017-12-29 DIAGNOSIS — M67912 Unspecified disorder of synovium and tendon, left shoulder: Secondary | ICD-10-CM | POA: Diagnosis not present

## 2018-01-03 DIAGNOSIS — Z4681 Encounter for fitting and adjustment of insulin pump: Secondary | ICD-10-CM | POA: Diagnosis not present

## 2018-01-03 DIAGNOSIS — E063 Autoimmune thyroiditis: Secondary | ICD-10-CM | POA: Diagnosis not present

## 2018-01-03 DIAGNOSIS — E1042 Type 1 diabetes mellitus with diabetic polyneuropathy: Secondary | ICD-10-CM | POA: Diagnosis not present

## 2018-01-03 DIAGNOSIS — E538 Deficiency of other specified B group vitamins: Secondary | ICD-10-CM | POA: Diagnosis not present

## 2018-01-03 DIAGNOSIS — E785 Hyperlipidemia, unspecified: Secondary | ICD-10-CM | POA: Diagnosis not present

## 2018-01-03 DIAGNOSIS — H2512 Age-related nuclear cataract, left eye: Secondary | ICD-10-CM | POA: Diagnosis not present

## 2018-01-03 DIAGNOSIS — E038 Other specified hypothyroidism: Secondary | ICD-10-CM | POA: Diagnosis not present

## 2018-01-03 DIAGNOSIS — E10649 Type 1 diabetes mellitus with hypoglycemia without coma: Secondary | ICD-10-CM | POA: Diagnosis not present

## 2018-01-03 DIAGNOSIS — M67912 Unspecified disorder of synovium and tendon, left shoulder: Secondary | ICD-10-CM | POA: Diagnosis not present

## 2018-01-05 ENCOUNTER — Encounter: Payer: Self-pay | Admitting: *Deleted

## 2018-01-06 DIAGNOSIS — E1042 Type 1 diabetes mellitus with diabetic polyneuropathy: Secondary | ICD-10-CM | POA: Diagnosis not present

## 2018-01-06 DIAGNOSIS — E10649 Type 1 diabetes mellitus with hypoglycemia without coma: Secondary | ICD-10-CM | POA: Diagnosis not present

## 2018-01-11 ENCOUNTER — Encounter: Payer: Self-pay | Admitting: Physical Therapy

## 2018-01-11 ENCOUNTER — Ambulatory Visit: Payer: PPO | Attending: Family Medicine | Admitting: Physical Therapy

## 2018-01-11 ENCOUNTER — Other Ambulatory Visit: Payer: Self-pay

## 2018-01-11 DIAGNOSIS — M25512 Pain in left shoulder: Secondary | ICD-10-CM | POA: Diagnosis not present

## 2018-01-11 DIAGNOSIS — M6281 Muscle weakness (generalized): Secondary | ICD-10-CM | POA: Diagnosis not present

## 2018-01-12 NOTE — Therapy (Signed)
[place] [facility] [address], [place], [address] Phone: [phone]   Fax:  [phone]  Physical Therapy Evaluation  Patient Details  Name: Christina Chen MRN: [id number] Date of Birth: July 19, 1946 Referring Provider: Dion Body MD   Encounter Date: 01/11/2018  PT End of Session - 01/11/18 0932    Visit Number  1    Number of Visits  12    Date for PT Re-Evaluation  02/22/18    Authorization Type  1 of 6 FOTO    PT Start Time  0830    PT Stop Time  0930    PT Time Calculation (min)  60 min    Activity Tolerance  Patient tolerated treatment well    Behavior During Therapy  [facility] for tasks assessed/performed       Past Medical History:  Diagnosis Date  . Arthritis   . Diabetes mellitus without complication (HCC)    Type I insulin pump  . Hypercholesterolemia   . Hypertension   . Hypothyroidism   . Psoriasis     Past Surgical History:  Procedure Laterality Date  . APPENDECTOMY    . CHOLECYSTECTOMY    . COLONOSCOPY    . FOOT SURGERY      There were no vitals filed for this visit.   Subjective Assessment - 01/11/18 0842    Subjective  Patient reports she is currently having less pain in left shoulder since injection 01/05/18.     Pertinent History  Patient reports left shoulder pain began a while ago with insidious onset over 1 month ago. She had an injection left shoulder 01/05/18 with good results and is now having less pain with raising her arm.     Limitations  House hold activities;Lifting reaching, pushing, overhead activities    Diagnostic tests  MRI right shoulder: results in chart: rotator cuff tendinopathy most notable in supraspinatus tendon, moderately severe arthritis AC joint; none for left shoulder     Patient Stated Goals  to raise arm up without pain and do light household duties without pain/difficulty    Currently in Pain?  Yes    Pain Score  2     Pain Location  Shoulder     Pain Orientation  Left    Pain Descriptors / Indicators  Aching;Sore;Discomfort    Pain Type  Acute pain    Pain Onset  More than a month ago    Pain Frequency  Intermittent    Aggravating Factors   raising arm up overhead, pushing, lifting    Pain Relieving Factors  medication, rest, ice, heat    Effect of Pain on Daily Activities  limits household chores, personal care         [facility] PT Assessment - 01/11/18 0851      Assessment   Medical Diagnosis  left shoulder rotator cuff tendonopathy    Referring Provider  Dion Body MD    Onset Date/Surgical Date  11/18/17    Hand Dominance  Right    Prior Therapy  for right shoulder last year, none for left shoulder      Precautions   Precautions  None      Balance Screen   Has the patient fallen in the past 6 months  No      [place] residence    Living Arrangements  Spouse/significant other    Type of [place]  Access  Stairs to enter    Entrance Stairs-Number of Steps  2    Entrance Stairs-Rails  Can reach both    Home Layout  Two level    Alternate Level Stairs-Number of Steps  10    Alternate Level Stairs-Rails  Can reach both      Prior Function   Level of Independence  Independent    Vocation  Retired worked in a [employer]  read, walking, spend time with family      Cognition   Overall Cognitive Status  Within Functional Limits for tasks assessed      Posture/Postural Control   Posture Comments  forward rounded shoulders, mild      ROM / Strength   AROM / PROM / Strength  AROM;Strength      AROM   Overall AROM Comments  shoulder flexion: right 120; left 140; ER bilaterally decreased functional motion; IR behind back bilaterlly to L4-5 with right shoulder pain and left shoulder discomfort      Strength   Overall Strength Comments  strength bilateral UE's grossly at least 4/5 major muscle groups without reproduction of symptoms; decreased scapular  control, retraction right>left       Palpation   Palpation comment  left shoulder:    bilateral upper trapezius muscles with incresaed tone/spasms      Neer Impingement test    Findings  Negative    Side  Left      Hawkins-Kennedy test   Findings  Negative      Empty Can test   Findings  Positive    Side  Left      Drop Arm test   Findings  Negative      Speeds test   findings  Negative          Objective measurements completed on examination: See above findings.     PT Education - 01/11/18 0910    Education provided  Yes    Education Details  HEP: scapular retraction, posture awareness; goals, FOTO results    Person(s) Educated  Patient    Methods  Explanation;Demonstration;Verbal cues;Handout    Comprehension  Verbal cues required;Returned demonstration;Verbalized understanding          PT Long Term Goals - 01/11/18 1234      PT LONG TERM GOAL #1   Title  Patient will demonstrate improved function with right UE with decreased pain and less difficulty as indicated by QuickDash score of 25% or less    Baseline  quick Dash 38%    Status  New    Target Date  02/08/18      PT LONG TERM GOAL #2   Title  Patient will improve FOTO score to 45/100 demonstrating improved function using left UE      Baseline  FOTO 36/100    Status  New    Target Date  02/01/18      PT LONG TERM GOAL #3   Title  Patient will improve FOTO score to 60/100 demonstrating improved function using left UE with overhead and reaching into cabinet     Baseline  FOTO 36/100    Status  New    Target Date  02/22/18      PT LONG TERM GOAL #4   Title  Patient will be independent with home program for pain control, exercise and appropriate progression in order to self manage symptoms once discharged     Baseline  limited knowledge of appropriate  pain control strategies, progression of exercises without cuing    Status  New    Target Date  02/22/18             Plan - 01/11/18 1111     Clinical Impression Statement  Patient is a 72 year old right hand dominant female who presents with acute pain left shoulder that is currently improving since cortisone injection 01/05/18. She has limitations in ROM and strength left UE/back and limited knowledge of appropriate pain control strategies and exercise progression and will benefit from physical therapy intervention to achieve goals. Her FOTO score of 36/100 indicates moderate self perceived impairment with daily function using left UE.     History and Personal Factors relevant to plan of care:  Patient reports left shoulder pain began a while ago with insidious onset over 1 month ago. She had an injection left shoulder 01/05/18 with good results and is now having less pain with raising her arm.     Clinical Presentation  Stable    Clinical Decision Making  Low    Rehab Potential  Good    Clinical Impairments Affecting Rehab Potential  (+)motivated, prior level of function (-)chronic condition, diabetes, HTN    PT Frequency  2x / week    PT Duration  6 weeks    PT Treatment/Interventions  Cryotherapy;Moist Heat;Ultrasound;Patient/family education;Neuromuscular re-education;Therapeutic exercise;Manual techniques    PT Next Visit Plan  discharge from physical therapy    PT Home Exercise Plan  positioning, self massage, scapular retraction, AAROM right UE    Consulted and Agree with Plan of Care  Patient       Patient will benefit from skilled therapeutic intervention in order to improve the following deficits and impairments:  Decreased strength, Pain, Impaired perceived functional ability, Decreased activity tolerance, Increased muscle spasms, Decreased range of motion  Visit Diagnosis: Muscle weakness (generalized) - Plan: PT plan of care cert/re-cert  Left shoulder pain, unspecified chronicity - Plan: PT plan of care cert/re-cert     Problem List There are no active problems to display for this patient.   Jomarie Longs  PT 01/12/2018, 12:49 PM  [place] [facility] [address] [place], [address] Phone: [phone]   Fax:  [phone]  Name: ALECIA DOI MRN: [id number] Date of Birth: [birth year]

## 2018-01-16 ENCOUNTER — Encounter: Payer: Self-pay | Admitting: Physical Therapy

## 2018-01-16 ENCOUNTER — Ambulatory Visit: Payer: PPO | Attending: Family Medicine | Admitting: Physical Therapy

## 2018-01-16 DIAGNOSIS — M25511 Pain in right shoulder: Secondary | ICD-10-CM

## 2018-01-16 DIAGNOSIS — M25512 Pain in left shoulder: Secondary | ICD-10-CM | POA: Insufficient documentation

## 2018-01-16 DIAGNOSIS — M6281 Muscle weakness (generalized): Secondary | ICD-10-CM | POA: Diagnosis not present

## 2018-01-16 NOTE — Therapy (Signed)
[place] [facility] [address] [place], [address] Phone: [phone]   Fax:  [phone]  Physical Therapy Treatment  Patient Details  Name: Christina Chen MRN: [id number] Date of Birth: 16-Feb-1946 Referring Provider: Dion Body MD   Encounter Date: 01/16/2018  PT End of Session - 01/16/18 0849    Visit Number  2    Number of Visits  12    Date for PT Re-Evaluation  02/22/18    Authorization Type  1 of 6 FOTO    PT Start Time  [phone]    PT Stop Time  0935    PT Time Calculation (min)  51 min    Activity Tolerance  Patient tolerated treatment well    Behavior During Therapy  [facility] for tasks assessed/performed       Past Medical History:  Diagnosis Date  . Arthritis   . Diabetes mellitus without complication (HCC)    Type I insulin pump  . Hypercholesterolemia   . Hypertension   . Hypothyroidism   . Psoriasis     Past Surgical History:  Procedure Laterality Date  . APPENDECTOMY    . CHOLECYSTECTOMY    . COLONOSCOPY    . FOOT SURGERY      There were no vitals filed for this visit.  Subjective Assessment - 01/16/18 0845    Subjective  Patient reports she continues to see improvement with left shoulder. She reports some pain and soreness in back of upper arm    Pertinent History  Patient reports left shoulder pain began a while ago with insidious onset over 1 month ago. She had an injection left shoulder 01/05/18 with good results and is now having less pain with raising her arm.     Limitations  House hold activities;Lifting reaching, pushing, overhead activities    Diagnostic tests  MRI right shoulder: results in chart: rotator cuff tendinopathy most notable in supraspinatus tendon, moderately severe arthritis AC joint; none for left shoulder     Patient Stated Goals  to raise arm up without pain and do light household duties without pain/difficulty    Currently in Pain?  Yes    Pain Score  2      Pain Location  Shoulder    Pain Descriptors / Indicators  Aching;Sore    Pain Type  Acute pain    Pain Onset  More than a month ago    Pain Frequency  Intermittent        Objective: Posture: moderate forward head Palpation: increased tone, spasms bilateral upper trapezius muscles   Treatment: Therapeutic Exercise: patient performed with instruction, demonstration, verbal and tactile cues of therapist: improve ROM, strength, functional use left UE  Supine:  scapular retraction with tactile and verbal cues x 15 reps serratus punches x 10 reps with guided motion, x 10 with 1# weight in hand with repeated cuing for correct technique to use shoulder and not rotate wrist RS 90 degrees flexion and neutral rotations x 10 reps each UE AAROM forward elevation overhead x 10 reps  Standing:  Wall push ups x 10 with VC Door frame scapular retractions with pillow behind head and repeated demonstration with VC for correct technique  Manual therapy: 10 min STM cervical spine and upper trapezius muscles with patient seated in chair with UE's supported, superficial techniques, supine lying upper trapezius stretches with MFR techniques x 3 reps each side  Patient response to treatment: patient demonstrated improved technique with exercises  with repeated demonstration and moderate VC for correct alignment, technique.  Patient with improved soft tissue elasticity up to 50% following STM. Improved motor control with repetition and cuing     PT Education - 01/16/18 0847    Education Details  re assessed home program    Person(s) Educated  Patient    Methods  Explanation;Demonstration;Verbal cues, handout   Comprehension  Verbalized understanding;Returned demonstration;Verbal cues required          PT Long Term Goals - 01/11/18 1234      PT LONG TERM GOAL #1   Title  Patient will demonstrate improved function with right UE with decreased pain and less difficulty as indicated by QuickDash  score of 25% or less    Baseline  quick Dash 38%    Status  New    Target Date  02/08/18      PT LONG TERM GOAL #2   Title  Patient will improve FOTO score to 45/100 demonstrating improved function using left UE      Baseline  FOTO 36/100    Status  New    Target Date  02/01/18      PT LONG TERM GOAL #3   Title  Patient will improve FOTO score to 60/100 demonstrating improved function using left UE with overhead and reaching into cabinet     Baseline  FOTO 36/100    Status  New    Target Date  02/22/18      PT LONG TERM GOAL #4   Title  Patient will be independent with home program for pain control, exercise and appropriate progression in order to self manage symptoms once discharged     Baseline  limited knowledge of appropriate pain control strategies, progression of exercises without cuing    Status  New    Target Date  02/22/18            Plan - 01/16/18 1015    Clinical Impression Statement  Patient improved motor control with exercises with repeated tactile and verbal cuing for correct technique. improved pain free ROM with both shoulders following treatment. She conitnues with decresaed strength and moderate forward head posture and will benefit from continued physical therapy intervention to achieve goals.     Rehab Potential  Good    Clinical Impairments Affecting Rehab Potential  (+)motivated, prior level of function (-)chronic condition, diabetes, HTN    PT Frequency  2x / week    PT Duration  6 weeks    PT Treatment/Interventions  Cryotherapy;Moist Heat;Ultrasound;Patient/family education;Neuromuscular re-education;Therapeutic exercise;Manual techniques    PT Next Visit Plan  discharge from physical therapy    PT Home Exercise Plan  positioning, self massage, scapular retraction, AAROM right UE    Consulted and Agree with Plan of Care  Patient       Patient will benefit from skilled therapeutic intervention in order to improve the following deficits and  impairments:  Decreased strength, Pain, Impaired perceived functional ability, Decreased activity tolerance, Increased muscle spasms, Decreased range of motion  Visit Diagnosis: Muscle weakness (generalized)  Left shoulder pain, unspecified chronicity  Right shoulder pain, unspecified chronicity     Problem List There are no active problems to display for this patient.   Jomarie Longs PT 01/16/2018, 10:18 AM  [place] [facility] [address], [place], [address] Phone: [phone]   Fax:  [phone]  Name: Christina Chen MRN: [id number] Date of Birth: 06-08-1946

## 2018-01-18 ENCOUNTER — Ambulatory Visit: Payer: PPO | Admitting: Physical Therapy

## 2018-01-21 DIAGNOSIS — H8309 Labyrinthitis, unspecified ear: Secondary | ICD-10-CM | POA: Diagnosis not present

## 2018-01-21 DIAGNOSIS — Q670 Congenital facial asymmetry: Secondary | ICD-10-CM | POA: Diagnosis not present

## 2018-01-23 ENCOUNTER — Ambulatory Visit: Payer: PPO | Admitting: Physical Therapy

## 2018-01-24 ENCOUNTER — Other Ambulatory Visit: Payer: Self-pay

## 2018-01-24 ENCOUNTER — Ambulatory Visit
Admission: RE | Admit: 2018-01-24 | Discharge: 2018-01-24 | Disposition: A | Discharge status: Home / Self Care | Payer: PPO | Source: Ambulatory Visit | Attending: Ophthalmology | Admitting: Ophthalmology

## 2018-01-24 ENCOUNTER — Encounter: Payer: Self-pay | Admitting: *Deleted

## 2018-01-24 ENCOUNTER — Ambulatory Visit: Payer: PPO | Admitting: Anesthesiology

## 2018-01-24 ENCOUNTER — Encounter
Admission: RE | Disposition: A | Discharge status: Home / Self Care | Payer: Self-pay | Source: Ambulatory Visit | Attending: Ophthalmology

## 2018-01-24 DIAGNOSIS — Z7982 Long term (current) use of aspirin: Secondary | ICD-10-CM | POA: Insufficient documentation

## 2018-01-24 DIAGNOSIS — Z79899 Other long term (current) drug therapy: Secondary | ICD-10-CM | POA: Diagnosis not present

## 2018-01-24 DIAGNOSIS — E039 Hypothyroidism, unspecified: Secondary | ICD-10-CM | POA: Insufficient documentation

## 2018-01-24 DIAGNOSIS — I10 Essential (primary) hypertension: Secondary | ICD-10-CM | POA: Diagnosis not present

## 2018-01-24 DIAGNOSIS — E7800 Pure hypercholesterolemia, unspecified: Secondary | ICD-10-CM | POA: Insufficient documentation

## 2018-01-24 DIAGNOSIS — Z794 Long term (current) use of insulin: Secondary | ICD-10-CM | POA: Diagnosis not present

## 2018-01-24 DIAGNOSIS — E109 Type 1 diabetes mellitus without complications: Secondary | ICD-10-CM | POA: Diagnosis not present

## 2018-01-24 DIAGNOSIS — Z79890 Hormone replacement therapy (postmenopausal): Secondary | ICD-10-CM | POA: Insufficient documentation

## 2018-01-24 DIAGNOSIS — H2512 Age-related nuclear cataract, left eye: Secondary | ICD-10-CM | POA: Diagnosis not present

## 2018-01-24 HISTORY — DX: Psoriasis, unspecified: L40.9

## 2018-01-24 HISTORY — DX: Pure hypercholesterolemia, unspecified: E78.00

## 2018-01-24 HISTORY — PX: CATARACT EXTRACTION W/PHACO: SHX586

## 2018-01-24 HISTORY — DX: Hypothyroidism, unspecified: E03.9

## 2018-01-24 HISTORY — DX: Essential (primary) hypertension: I10

## 2018-01-24 LAB — GLUCOSE, CAPILLARY: GLUCOSE-CAPILLARY: 183 mg/dL — AB (ref 65–99)

## 2018-01-24 SURGERY — PHACOEMULSIFICATION, CATARACT, WITH IOL INSERTION
Anesthesia: Monitor Anesthesia Care | Site: Eye | Laterality: Left | Wound class: "Clean "

## 2018-01-24 MED ORDER — SUCCINYLCHOLINE CHLORIDE 20 MG/ML IJ SOLN
INTRAMUSCULAR | Status: AC
  Filled 2018-01-24: qty 1

## 2018-01-24 MED ORDER — MIDAZOLAM HCL 2 MG/2ML IJ SOLN
INTRAMUSCULAR | Status: AC
  Filled 2018-01-24: qty 2

## 2018-01-24 MED ORDER — NA CHONDROIT SULF-NA HYALURON 40-17 MG/ML IO SOLN
INTRAOCULAR | Status: DC | PRN
  Administered 2018-01-24: 1 mL via INTRAOCULAR

## 2018-01-24 MED ORDER — PHENYLEPHRINE HCL 10 MG/ML IJ SOLN
INTRAMUSCULAR | Status: AC
  Filled 2018-01-24: qty 1

## 2018-01-24 MED ORDER — LIDOCAINE HCL (PF) 4 % IJ SOLN
INTRAMUSCULAR | Status: AC
Start: 2018-01-24 — End: ?
  Filled 2018-01-24: qty 5

## 2018-01-24 MED ORDER — LIDOCAINE HCL (PF) 4 % IJ SOLN
INTRAOCULAR | Status: DC | PRN
  Administered 2018-01-24: 2 mL via OPHTHALMIC

## 2018-01-24 MED ORDER — MOXIFLOXACIN HCL 0.5 % OP SOLN
OPHTHALMIC | Status: AC
  Filled 2018-01-24: qty 3

## 2018-01-24 MED ORDER — NA CHONDROIT SULF-NA HYALURON 40-17 MG/ML IO SOLN
INTRAOCULAR | Status: AC
  Filled 2018-01-24: qty 1

## 2018-01-24 MED ORDER — FENTANYL CITRATE (PF) 100 MCG/2ML IJ SOLN
INTRAMUSCULAR | Status: AC
  Filled 2018-01-24: qty 2

## 2018-01-24 MED ORDER — MOXIFLOXACIN HCL 0.5 % OP SOLN: 1.0000 [drp] | Freq: Once | OPHTHALMIC | Status: DC

## 2018-01-24 MED ORDER — MOXIFLOXACIN HCL 0.5 % OP SOLN
OPHTHALMIC | Status: DC | PRN
  Administered 2018-01-24: .2 mL via OPHTHALMIC

## 2018-01-24 MED ORDER — GLYCOPYRROLATE 0.2 MG/ML IJ SOLN
INTRAMUSCULAR | Status: AC
  Filled 2018-01-24: qty 1

## 2018-01-24 MED ORDER — PROPOFOL 10 MG/ML IV BOLUS
INTRAVENOUS | Status: AC
  Filled 2018-01-24: qty 20

## 2018-01-24 MED ORDER — EPINEPHRINE PF 1 MG/ML IJ SOLN
INTRAMUSCULAR | Status: AC
  Filled 2018-01-24: qty 1

## 2018-01-24 MED ORDER — EPINEPHRINE PF 1 MG/ML IJ SOLN
INTRAOCULAR | Status: DC | PRN
  Administered 2018-01-24: 1 mL via OPHTHALMIC

## 2018-01-24 MED ORDER — SODIUM CHLORIDE 0.9 % IV SOLN
INTRAVENOUS | Status: DC
  Administered 2018-01-24: 06:00:00 via INTRAVENOUS

## 2018-01-24 MED ORDER — ARMC OPHTHALMIC DILATING DROPS
OPHTHALMIC | Status: AC
  Filled 2018-01-24: qty 0.4

## 2018-01-24 MED ORDER — MIDAZOLAM HCL 2 MG/2ML IJ SOLN
INTRAMUSCULAR | Status: DC | PRN
  Administered 2018-01-24: 1 mg via INTRAVENOUS

## 2018-01-24 MED ORDER — CARBACHOL 0.01 % IO SOLN
INTRAOCULAR | Status: DC | PRN
  Administered 2018-01-24: .5 mL via INTRAOCULAR

## 2018-01-24 MED ORDER — POVIDONE-IODINE 5 % OP SOLN
OPHTHALMIC | Status: DC | PRN
  Administered 2018-01-24: 1 via OPHTHALMIC

## 2018-01-24 MED ORDER — ARMC OPHTHALMIC DILATING DROPS
1.0000 "application " | OPHTHALMIC | Status: AC
  Administered 2018-01-24 (×3): 1 via OPHTHALMIC

## 2018-01-24 SURGICAL SUPPLY — 16 items
GLOVE BIO SURGEON STRL SZ8 (GLOVE) ×3 IMPLANT
GLOVE BIOGEL M 6.5 STRL (GLOVE) ×3 IMPLANT
GLOVE SURG LX 8.0 MICRO (GLOVE) ×2
GLOVE SURG LX STRL 8.0 MICRO (GLOVE) ×1 IMPLANT
GOWN STRL REUS W/ TWL LRG LVL3 (GOWN DISPOSABLE) ×2 IMPLANT
GOWN STRL REUS W/TWL LRG LVL3 (GOWN DISPOSABLE) ×4
LABEL CATARACT MEDS ST (LABEL) ×3 IMPLANT
LENS IOL TECNIS ITEC 20.0 (Intraocular Lens) ×2 IMPLANT
PACK CATARACT (MISCELLANEOUS) ×3 IMPLANT
PACK CATARACT BRASINGTON LX (MISCELLANEOUS) ×3 IMPLANT
PACK EYE AFTER SURG (MISCELLANEOUS) ×3 IMPLANT
SOL BSS BAG (MISCELLANEOUS) ×3
SOLUTION BSS BAG (MISCELLANEOUS) ×1 IMPLANT
SYR 5ML LL (SYRINGE) ×3 IMPLANT
WATER STERILE IRR 250ML POUR (IV SOLUTION) ×3 IMPLANT
WIPE NON LINTING 3.25X3.25 (MISCELLANEOUS) ×3 IMPLANT

## 2018-01-24 NOTE — Anesthesia Preprocedure Evaluation (Addendum)
Anesthesia Evaluation  Patient identified by MRN, date of birth, ID band Patient awake    Reviewed: Allergy & Precautions, H&P , NPO status , reviewed documented beta blocker date and time   Airway Mallampati: II  TM Distance: >3 FB Neck ROM: full    Dental  (+) Chipped   Pulmonary    Pulmonary exam normal        Cardiovascular hypertension, Normal cardiovascular exam     Neuro/Psych    GI/Hepatic neg GERD  ,  Endo/Other  diabetes, Type 1, Insulin DependentHypothyroidism   Renal/GU      Musculoskeletal  (+) Arthritis ,   Abdominal   Peds  Hematology   Anesthesia Other Findings . Arthritis  . Diabetes mellitus without complication (HCC)   Type I insulin pump . Hypercholesterolemia  . Hypertension  . Hypothyroidism  . Psoriasis    Past Surgical History: Procedure Laterality Date . APPENDECTOMY   . CHOLECYSTECTOMY   . COLONOSCOPY   . FOOT SURGERY  Reproductive/Obstetrics                            Anesthesia Physical Anesthesia Plan  ASA: III  Anesthesia Plan: MAC   Post-op Pain Management:    Induction:   PONV Risk Score and Plan: 2 and TIVA  Airway Management Planned:   Additional Equipment:   Intra-op Plan:   Post-operative Plan:   Informed Consent: I have reviewed the patients History and Physical, chart, labs and discussed the procedure including the risks, benefits and alternatives for the proposed anesthesia with the patient or authorized representative who has indicated his/her understanding and acceptance.   Dental Advisory Given  Plan Discussed with: CRNA  Anesthesia Plan Comments:        Anesthesia Quick Evaluation

## 2018-01-24 NOTE — Transfer of Care (Signed)
Immediate Anesthesia Transfer of Care Note  Patient: Christina Chen  Procedure(s) Performed: CATARACT EXTRACTION PHACO AND INTRAOCULAR LENS PLACEMENT (IOC) (Left Eye)  Patient Location: Short Stay  Anesthesia Type:MAC  Level of Consciousness: awake, alert  and oriented  Airway & Oxygen Therapy: Patient Spontanous Breathing  Post-op Assessment: Report given to RN and Post -op Vital signs reviewed and stable  Post vital signs: Reviewed and stable  Last Vitals:  Vitals Value Taken Time  BP    Temp    Pulse    Resp    SpO2      Last Pain:  Vitals:   01/24/18 0608  TempSrc: Tympanic  PainSc: 0-No pain         Complications: No apparent anesthesia complications

## 2018-01-24 NOTE — Discharge Instructions (Signed)

## 2018-01-24 NOTE — Op Note (Signed)
PREOPERATIVE DIAGNOSIS:  Nuclear sclerotic cataract of the left eye.   POSTOPERATIVE DIAGNOSIS:  Nuclear sclerotic cataract of the left eye.   OPERATIVE PROCEDURE: Procedure(s): CATARACT EXTRACTION PHACO AND INTRAOCULAR LENS PLACEMENT (IOC)   SURGEON:  Birder Robson, MD.   ANESTHESIA:  Anesthesiologist: Alphonsus Sias, MD CRNA: Eben Burow, CRNA Student Nurse Anesthetist: Zachery Dauer, RN  1.      Managed anesthesia care. 2.     0.93ml of Shugarcaine was instilled following the paracentesis   COMPLICATIONS:  None.   TECHNIQUE:   Stop and chop   DESCRIPTION OF PROCEDURE:  The patient was examined and consented in the preoperative holding area where the aforementioned topical anesthesia was applied to the left eye and then brought back to the Operating Room where the left eye was prepped and draped in the usual sterile ophthalmic fashion and a lid speculum was placed. A paracentesis was created with the side port blade and the anterior chamber was filled with viscoelastic. A near clear corneal incision was performed with the steel keratome. A continuous curvilinear capsulorrhexis was performed with a cystotome followed by the capsulorrhexis forceps. Hydrodissection and hydrodelineation were carried out with BSS on a blunt cannula. The lens was removed in a stop and chop  technique and the remaining cortical material was removed with the irrigation-aspiration handpiece. The capsular bag was inflated with viscoelastic and the Technis [id number] lens was placed in the capsular bag without complication. The remaining viscoelastic was removed from the eye with the irrigation-aspiration handpiece. The wounds were hydrated. The anterior chamber was flushed with Miostat and the eye was inflated to physiologic pressure. 0.66ml Vigamox was placed in the anterior chamber. The wounds were found to be water tight. The eye was dressed with Vigamox. The patient was given protective glasses to wear  throughout the day and a shield with which to sleep tonight. The patient was also given drops with which to begin a drop regimen today and will follow-up with me in one day. Implant Name Type Inv. Item Serial No. Manufacturer Lot No. LRB No. Used  LENS IOL DIOP 20.0 - [id number] Intraocular Lens LENS IOL DIOP 20.0 [id number]  Left 1    Procedure(s) with comments: CATARACT EXTRACTION PHACO AND INTRAOCULAR LENS PLACEMENT (IOC) (Left) - [place] 01:28 AP% 15.3 CDE 13.61 Fluid pack lot # [id number]  Electronically signed: Birder Robson 01/24/2018 7:47 AM

## 2018-01-24 NOTE — Anesthesia Postprocedure Evaluation (Signed)
Anesthesia Post Note  Patient: Christina Chen  Procedure(s) Performed: CATARACT EXTRACTION PHACO AND INTRAOCULAR LENS PLACEMENT (IOC) (Left Eye)  Patient location during evaluation: Short Stay Anesthesia Type: MAC Level of consciousness: awake and alert Pain management: pain level controlled Vital Signs Assessment: post-procedure vital signs reviewed and stable Respiratory status: spontaneous breathing, nonlabored ventilation and respiratory function stable Cardiovascular status: blood pressure returned to baseline and stable Postop Assessment: no apparent nausea or vomiting Anesthetic complications: no     Last Vitals:  Vitals:   01/24/18 0749 01/24/18 0759  BP: (!) 149/75 135/66  Pulse: 62 (!) 54  Resp: 16 16  Temp: (!) 35.7 C   SpO2: 96% 100%    Last Pain:  Vitals:   01/24/18 0759  TempSrc:   PainSc: 0-No pain                 Alphonsus Sias

## 2018-01-24 NOTE — Anesthesia Post-op Follow-up Note (Signed)
Anesthesia QCDR form completed.        

## 2018-01-24 NOTE — H&P (Signed)
All labs reviewed. Abnormal studies sent to patients PCP when indicated.  Previous H&P reviewed, patient examined, there are NO CHANGES.  Christina Mun Porfilio4/9/20197:15 AM

## 2018-01-25 ENCOUNTER — Ambulatory Visit: Payer: PPO | Admitting: Physical Therapy

## 2018-01-30 ENCOUNTER — Encounter: Payer: PPO | Admitting: Physical Therapy

## 2018-01-30 ENCOUNTER — Encounter: Payer: Self-pay | Admitting: Physical Therapy

## 2018-01-30 ENCOUNTER — Ambulatory Visit: Payer: PPO | Admitting: Physical Therapy

## 2018-01-30 DIAGNOSIS — M25512 Pain in left shoulder: Secondary | ICD-10-CM

## 2018-01-30 DIAGNOSIS — M25511 Pain in right shoulder: Secondary | ICD-10-CM

## 2018-01-30 DIAGNOSIS — M6281 Muscle weakness (generalized): Secondary | ICD-10-CM | POA: Diagnosis not present

## 2018-01-30 NOTE — Therapy (Signed)
[facility] [address] [place], [address] Phone: [phone]   Fax:  [phone]  Physical Therapy Treatment  Patient Details  Name: Christina Chen MRN: [id number] Date of Birth: 1946-07-21 Referring Provider: Dion Body MD   Encounter Date: 01/30/2018  PT End of Session - 01/30/18 0938    Visit Number  3    Number of Visits  12    Date for PT Re-Evaluation  02/22/18    Authorization Type  3 of 6 FOTO    PT Start Time  0935    PT Stop Time  1015    PT Time Calculation (min)  40 min    Activity Tolerance  Patient tolerated treatment well    Behavior During Therapy  [facility] for tasks assessed/performed       Past Medical History:  Diagnosis Date  . Arthritis   . Diabetes mellitus without complication (HCC)    Type I insulin pump  . Hypercholesterolemia   . Hypertension   . Hypothyroidism   . Psoriasis     Past Surgical History:  Procedure Laterality Date  . APPENDECTOMY    . CATARACT EXTRACTION W/PHACO Left 01/24/2018   Procedure: CATARACT EXTRACTION PHACO AND INTRAOCULAR LENS PLACEMENT (IOC);  Surgeon: Birder Robson, MD;  Location: [facility] ORS;  Service: Ophthalmology;  Laterality: Left;  [place] 01:28 AP% 15.3 CDE 13.61 Fluid pack lot # [id number]  . CHOLECYSTECTOMY    . COLONOSCOPY    . FOOT SURGERY      There were no vitals filed for this visit.  Subjective Assessment - 01/30/18 0937    Subjective  Patient reports she had an episode of vertigo since her previous session.     Pertinent History  Patient reports left shoulder pain began a while ago with insidious onset over 1 month ago. She had an injection left shoulder 01/05/18 with good results and is now having less pain with raising her arm.     Limitations  House hold activities;Lifting reaching, pushing, overhead activities    Diagnostic tests  MRI right shoulder: results in chart: rotator cuff tendinopathy most notable in  supraspinatus tendon, moderately severe arthritis AC joint; none for left shoulder     Patient Stated Goals  to raise arm up without pain and do light household duties without pain/difficulty    Currently in Pain?  No/denies       Objective: Posture: moderate forward head posture  Palpation: increased tone, spasms bilateral upper trapezius muscles, improved from previous session     Treatment: Therapeutic Exercise: patient performed with instruction, demonstration, verbal and tactile cues of therapist: improve ROM, strength, functional use left UE   Supine:  scapular retraction with tactile and verbal cues x 10 reps serratus punches  2 x 10 with repeated cuing for correct technique RS 90 degrees flexion and neutral rotations 2 x 10 reps each UE AAROM forward elevation overhead  x 10 reps   Standing:  Wall push ups x 10 with VC Door frame scapular retractions with pillow behind head and repeated demonstration with VC for correct technique scapular retraction with green band 2 x 10 shoulder extension to hips 2 x 10 green band   Manual therapy: 10 min; goal: improve soft tissue elasticity STM cervical spine and upper trapezius muscles with patient seated in chair with UE's supported, superficial techniques,  upper trapezius stretches with MFR techniques x 3 reps each side   Patient response to treatment: improved  technique with exercises with moderate cuing and repeated demonstration. Improved soft tissue elasticity bilateral upper trapezius muscles s/p STM with decreased hiking of shoulders.     PT Education - 01/30/18 1021    Education provided  Yes    Education Details  exercise instruction for technique, posture    Person(s) Educated  Patient    Methods  Explanation;Demonstration;Verbal cues;Handout    Comprehension  Verbal cues required;Returned demonstration;Verbalized understanding          PT Long Term Goals - 01/11/18 1234      PT LONG TERM GOAL #1   Title   Patient will demonstrate improved function with right UE with decreased pain and less difficulty as indicated by QuickDash score of 25% or less    Baseline  quick Dash 38%    Status  New    Target Date  02/08/18      PT LONG TERM GOAL #2   Title  Patient will improve FOTO score to 45/100 demonstrating improved function using left UE      Baseline  FOTO 36/100    Status  New    Target Date  02/01/18      PT LONG TERM GOAL #3   Title  Patient will improve FOTO score to 60/100 demonstrating improved function using left UE with overhead and reaching into cabinet     Baseline  FOTO 36/100    Status  New    Target Date  02/22/18      PT LONG TERM GOAL #4   Title  Patient will be independent with home program for pain control, exercise and appropriate progression in order to self manage symptoms once discharged     Baseline  limited knowledge of appropriate pain control strategies, progression of exercises without cuing    Status  New    Target Date  02/22/18            Plan - 01/30/18 1019    Clinical Impression Statement  Patient continues steady progress towards goals with decreasing pain, improving posture, strength bilateral UE shoulders. She contiues to require repeated cuing for all exercise and guided instruction to progress exercises and will benefit from continued physical therapy intervention.     Rehab Potential  Good    Clinical Impairments Affecting Rehab Potential  (+)motivated, prior level of function (-)chronic condition, diabetes, HTN    PT Frequency  2x / week    PT Duration  6 weeks    PT Treatment/Interventions  Cryotherapy;Moist Heat;Ultrasound;Patient/family education;Neuromuscular re-education;Therapeutic exercise;Manual techniques    PT Next Visit Plan  discharge from physical therapy    PT Home Exercise Plan  positioning, self massage, scapular retraction, AAROM right UE       Patient will benefit from skilled therapeutic intervention in order to improve  the following deficits and impairments:  Decreased strength, Pain, Impaired perceived functional ability, Decreased activity tolerance, Increased muscle spasms, Decreased range of motion  Visit Diagnosis: Muscle weakness (generalized)  Left shoulder pain, unspecified chronicity  Right shoulder pain, unspecified chronicity     Problem List There are no active problems to display for this patient.   Jomarie Longs PT 01/30/2018, 10:22 AM  [place] [facility] [address], [place], [address] Phone: [phone]   Fax:  [phone]  Name: Christina Chen MRN: [id number] Date of Birth: 03/30/1946

## 2018-01-31 ENCOUNTER — Encounter: Payer: PPO | Admitting: Physical Therapy

## 2018-02-06 ENCOUNTER — Ambulatory Visit: Payer: PPO | Admitting: Physical Therapy

## 2018-02-06 ENCOUNTER — Encounter: Payer: Self-pay | Admitting: Physical Therapy

## 2018-02-06 DIAGNOSIS — M25512 Pain in left shoulder: Secondary | ICD-10-CM

## 2018-02-06 DIAGNOSIS — M6281 Muscle weakness (generalized): Secondary | ICD-10-CM | POA: Diagnosis not present

## 2018-02-06 DIAGNOSIS — M25511 Pain in right shoulder: Secondary | ICD-10-CM

## 2018-02-06 NOTE — Therapy (Signed)
[place] [facility] [address] [place], [address] Phone: [phone]   Fax:  [phone]  Physical Therapy Treatment  Patient Details  Name: Christina Chen MRN: [id number] Date of Birth: 1946/08/07 Referring Provider: Dion Body MD   Encounter Date: 02/06/2018  PT End of Session - 02/06/18 1353    Visit Number  4    Number of Visits  12    Date for PT Re-Evaluation  02/22/18    Authorization Type  4 of 6 FOTO    PT Start Time  1347    PT Stop Time  1428    PT Time Calculation (min)  41 min    Activity Tolerance  Patient tolerated treatment well    Behavior During Therapy  [facility] for tasks assessed/performed       Past Medical History:  Diagnosis Date  . Arthritis   . Diabetes mellitus without complication (HCC)    Type I insulin pump  . Hypercholesterolemia   . Hypertension   . Hypothyroidism   . Psoriasis     Past Surgical History:  Procedure Laterality Date  . APPENDECTOMY    . CATARACT EXTRACTION W/PHACO Left 01/24/2018   Procedure: CATARACT EXTRACTION PHACO AND INTRAOCULAR LENS PLACEMENT (IOC);  Surgeon: Birder Robson, MD;  Location: [facility] ORS;  Service: Ophthalmology;  Laterality: Left;  [place] 01:28 AP% 15.3 CDE 13.61 Fluid pack lot # [id number]  . CHOLECYSTECTOMY    . COLONOSCOPY    . FOOT SURGERY      There were no vitals filed for this visit.  Subjective Assessment - 02/06/18 1350    Subjective  Patient repots she is progressing well with shoulder; She is doing better with reaching into cabinets. She continues with pain with reaching to the back with Right arm.     Pertinent History  Patient reports left shoulder pain began a while ago with insidious onset over 1 month ago. She had an injection left shoulder 01/05/18 with good results and is now having less pain with raising her arm.     Limitations  House hold activities;Lifting reaching, pushing, overhead activities    Diagnostic  tests  MRI right shoulder: results in chart: rotator cuff tendinopathy most notable in supraspinatus tendon, moderately severe arthritis AC joint; none for left shoulder     Patient Stated Goals  to raise arm up without pain and do light household duties without pain/difficulty    Currently in Pain?  No/denies        Objective: Posture: moderate forward head posture  Palpation: increased tone, spasms bilateral upper trapezius muscles, improved from previous session     Treatment: Therapeutic Exercise: patient performed with instruction, demonstration, verbal and tactile cues of therapist: improve ROM, strength, functional use left UE   Supine:  scapular retraction with tactile and verbal cues x 10 reps RS 90 degrees flexion and neutral rotations 2 x 10 reps each UE AAROM forward elevation overhead  x 10 reps each UE   Standing:  Door frame scapular retractions with pillow behind head and repeated demonstration with VC for correct technique scapular retraction with green band x 10 shoulder extension to hips  x 10 green band   Manual therapy: 15 min; goal: improve soft tissue elasticity STM cervical spine and upper trapezius muscles with patient seated in chair with UE's supported, superficial techniques,  upper trapezius stretches with MFR techniques x 3 reps each side   Patient response to treatment: patient demonstrated  improved technique with exercises with minimal VC for correct alignment. Patient with decreased spasms and improved soft tissue elasticity left shoulder by 50% following STM. Improved motor control with repetition and cuing    PT Education - 02/06/18 1353    Education provided  Yes    Education Details  exercise instruction for technique    Person(s) Educated  Patient    Methods  Explanation;Demonstration;Verbal cues    Comprehension  Verbalized understanding;Returned demonstration;Verbal cues required          PT Long Term Goals - 01/11/18 1234      PT  LONG TERM GOAL #1   Title  Patient will demonstrate improved function with right UE with decreased pain and less difficulty as indicated by QuickDash score of 25% or less    Baseline  quick Dash 38%    Status  New    Target Date  02/08/18      PT LONG TERM GOAL #2   Title  Patient will improve FOTO score to 45/100 demonstrating improved function using left UE      Baseline  FOTO 36/100    Status  New    Target Date  02/01/18      PT LONG TERM GOAL #3   Title  Patient will improve FOTO score to 60/100 demonstrating improved function using left UE with overhead and reaching into cabinet     Baseline  FOTO 36/100    Status  New    Target Date  02/22/18      PT LONG TERM GOAL #4   Title  Patient will be independent with home program for pain control, exercise and appropriate progression in order to self manage symptoms once discharged     Baseline  limited knowledge of appropriate pain control strategies, progression of exercises without cuing    Status  New    Target Date  02/22/18            Plan - 02/06/18 1430    Clinical Impression Statement  Patient is progressing well with goals with improving strength and ROM bilateral shoulders. She required minimal cuing for improved technique with scapular rows and supine exercises.     Rehab Potential  Good    Clinical Impairments Affecting Rehab Potential  (+)motivated, prior level of function (-)chronic condition, diabetes, HTN    PT Frequency  2x / week    PT Duration  6 weeks    PT Treatment/Interventions  Cryotherapy;Moist Heat;Ultrasound;Patient/family education;Neuromuscular re-education;Therapeutic exercise;Manual techniques    PT Next Visit Plan  discharge from physical therapy    PT Home Exercise Plan  positioning, self massage, scapular retraction, AAROM right UE       Patient will benefit from skilled therapeutic intervention in order to improve the following deficits and impairments:  Decreased strength, Pain, Impaired  perceived functional ability, Decreased activity tolerance, Increased muscle spasms, Decreased range of motion  Visit Diagnosis: Muscle weakness (generalized)  Left shoulder pain, unspecified chronicity  Right shoulder pain, unspecified chronicity     Problem List There are no active problems to display for this patient.   Jomarie Longs PT 02/06/2018, 2:31 PM  [place] [facility] [address] [place], [address] Phone: [phone]   Fax:  [phone]  Name: Christina Chen MRN: [id number] Date of Birth: 12/31/1945

## 2018-02-08 ENCOUNTER — Ambulatory Visit: Payer: PPO | Admitting: Physical Therapy

## 2018-02-08 ENCOUNTER — Encounter: Payer: Self-pay | Admitting: Physical Therapy

## 2018-02-08 DIAGNOSIS — M25511 Pain in right shoulder: Secondary | ICD-10-CM

## 2018-02-08 DIAGNOSIS — M6281 Muscle weakness (generalized): Secondary | ICD-10-CM

## 2018-02-08 DIAGNOSIS — M25512 Pain in left shoulder: Secondary | ICD-10-CM

## 2018-02-08 NOTE — Therapy (Signed)
[place] [facility] [address] [place], [address] Phone: [phone]   Fax:  [phone]  Physical Therapy Treatment/Discharge Summary  Patient Details  Name: Christina Chen MRN: [id number] Date of Birth: 1946-10-07 Referring Provider: Dion Body MD   Encounter Date: 02/08/2018   Patient began physical therapy on 01/11/2018 and has attended  5 sessions through 02/08/2018. She has achieved all goals and is independent in home program for continued self management of pain/symptoms and exercises as instructed. Plan discharge from physical therapy at this time.    PT End of Session - 02/08/18 1027    Visit Number  5    Number of Visits  12    Date for PT Re-Evaluation  02/22/18    Authorization Type  5 of 6 FOTO    PT Start Time  1025    PT Stop Time  1100    PT Time Calculation (min)  35 min    Activity Tolerance  Patient tolerated treatment well    Behavior During Therapy  [facility] for tasks assessed/performed       Past Medical History:  Diagnosis Date  . Arthritis   . Diabetes mellitus without complication (HCC)    Type I insulin pump  . Hypercholesterolemia   . Hypertension   . Hypothyroidism   . Psoriasis     Past Surgical History:  Procedure Laterality Date  . APPENDECTOMY    . CATARACT EXTRACTION W/PHACO Left 01/24/2018   Procedure: CATARACT EXTRACTION PHACO AND INTRAOCULAR LENS PLACEMENT (IOC);  Surgeon: Birder Robson, MD;  Location: [facility] ORS;  Service: Ophthalmology;  Laterality: Left;  [place] 01:28 AP% 15.3 CDE 13.61 Fluid pack lot # [id number]  . CHOLECYSTECTOMY    . COLONOSCOPY    . FOOT SURGERY      There were no vitals filed for this visit.  Subjective Assessment - 02/08/18 1026    Subjective  Patient would like to discharge to independent home program. Patient repots she is progressing well with shoulder; She is doing better with reaching into cabinets. She continues with pain with  reaching to the back with Right arm.     Pertinent History  Patient reports left shoulder pain began a while ago with insidious onset over 1 month ago. She had an injection left shoulder 01/05/18 with good results and is now having less pain with raising her arm.     Limitations  House hold activities;Lifting reaching, pushing, overhead activities    Diagnostic tests  MRI right shoulder: results in chart: rotator cuff tendinopathy most notable in supraspinatus tendon, moderately severe arthritis AC joint; none for left shoulder     Patient Stated Goals  to raise arm up without pain and do light household duties without pain/difficulty    Currently in Pain?  No/denies       Evalyn Casco: Discharge Objective:  palpation: decreased soft tissue mobility suboccipitally, SCM's, upper trapezius muscles right>left posture: moderate forward head posture FOTO score 83/100 (initially 36/100) quickDash 2.3% (initially 38%)   Treatment:  Manual Therapy: x 8 min. Goal: improve soft tissue elasticity, pain supine lying:  STM to cervical spine and upper trapezius with patient seated with back and UE's supported   Therapeutic exercise: patient performed with demonstration, verbal cues of therapist: goal: improved strength, endurance, function for daily tasks  Standing:  Scapular rows high and low 2 x 15 reps green resistive band Wall push ups 2 x 10 Pizza carry with 1-2# dumbbells  2 x 15 scaption with 1# dumbbells 2 x 15  Supine: Serratus punches x 10 each UE Chest press 2# weight left UE x 10 with guided motion for correct technique RS circles CW/CCW each UE with 1# dumbbell 10x each   Patient response to treatment: improved technique with exercises following demonstration and with VC. Improved soft tissue elasticity by 50% following STM   PT Education - 02/08/18 1030    Education provided  Yes    Education Details  re assessed home program to continue    Person(s) Educated  Patient    Methods   Explanation;Demonstration;Verbal cues;Handout    Comprehension  Verbal cues required;Returned demonstration;Verbalized understanding          PT Long Term Goals - 02/08/18 1034      PT LONG TERM GOAL #1   Title  Patient will demonstrate improved function with right UE with decreased pain and less difficulty as indicated by QuickDash score of 25% or less    Baseline  quick Dash 38% ; 02/08/18 2.3%   Status  Achieved      PT LONG TERM GOAL #2   Title  Patient will improve FOTO score to 45/100 demonstrating improved function using left UE      Baseline  FOTO 36/100; 02/08/18 83/100    Status  Achieved      PT LONG TERM GOAL #3   Title  Patient will improve FOTO score to 60/100 demonstrating improved function using left UE with overhead and reaching into cabinet     Baseline  FOTO 36/100; 02/08/18 83/100    Status  Achieved      PT LONG TERM GOAL #4   Title  Patient will be independent with home program for pain control, exercise and appropriate progression in order to self manage symptoms once discharged     Baseline  limited knowledge of appropriate pain control strategies, progression of exercises without cuing    Status  Achieved            Plan - 02/08/18 1100    Clinical Impression Statement  Patient has achieved all goals and has FOTO score of 83/100 with improved functional use of left UE for all daily tasks. She is indepenendent with home exercises and should continue to improve with self managment.     Rehab Potential  Good    Clinical Impairments Affecting Rehab Potential  (+)motivated, prior level of function (-)chronic condition, diabetes, HTN    PT Frequency  2x / week    PT Duration  6 weeks    PT Treatment/Interventions  Cryotherapy;Moist Heat;Ultrasound;Patient/family education;Neuromuscular re-education;Therapeutic exercise;Manual techniques    PT Next Visit Plan  discharge from physical therapy    PT Home Exercise Plan  positioning, self massage, scapular  retraction; strengthening and ROM exercise as instructed    Consulted and Agree with Plan of Care  Patient       Patient will benefit from skilled therapeutic intervention in order to improve the following deficits and impairments:  Decreased strength, Pain, Impaired perceived functional ability, Decreased activity tolerance, Increased muscle spasms, Decreased range of motion  Visit Diagnosis: Muscle weakness (generalized)  Left shoulder pain, unspecified chronicity  Right shoulder pain, unspecified chronicity     Problem List There are no active problems to display for this patient.   Jomarie Longs PT 02/09/2018, 8:58 AM  [place] [facility] [address] [place], [address] Phone: [phone]   Fax:  [phone]  Name: Christina Chen MRN: [id number] Date of Birth: Sep 20, 1946

## 2018-02-09 DIAGNOSIS — H2511 Age-related nuclear cataract, right eye: Secondary | ICD-10-CM | POA: Diagnosis not present

## 2018-02-13 ENCOUNTER — Ambulatory Visit: Payer: PPO | Admitting: Physical Therapy

## 2018-02-15 ENCOUNTER — Encounter: Payer: Self-pay | Admitting: *Deleted

## 2018-02-16 ENCOUNTER — Encounter: Payer: PPO | Admitting: Physical Therapy

## 2018-02-16 DIAGNOSIS — E10649 Type 1 diabetes mellitus with hypoglycemia without coma: Secondary | ICD-10-CM | POA: Diagnosis not present

## 2018-02-16 DIAGNOSIS — E1042 Type 1 diabetes mellitus with diabetic polyneuropathy: Secondary | ICD-10-CM | POA: Diagnosis not present

## 2018-02-21 ENCOUNTER — Other Ambulatory Visit: Payer: Self-pay

## 2018-02-21 ENCOUNTER — Ambulatory Visit
Admission: RE | Admit: 2018-02-21 | Discharge: 2018-02-21 | Disposition: A | Discharge status: Home / Self Care | Payer: PPO | Source: Ambulatory Visit | Attending: Ophthalmology | Admitting: Ophthalmology

## 2018-02-21 ENCOUNTER — Ambulatory Visit: Payer: PPO | Admitting: Anesthesiology

## 2018-02-21 ENCOUNTER — Encounter
Admission: RE | Disposition: A | Discharge status: Home / Self Care | Payer: Self-pay | Source: Ambulatory Visit | Attending: Ophthalmology

## 2018-02-21 DIAGNOSIS — Z7982 Long term (current) use of aspirin: Secondary | ICD-10-CM | POA: Diagnosis not present

## 2018-02-21 DIAGNOSIS — Z885 Allergy status to narcotic agent status: Secondary | ICD-10-CM | POA: Insufficient documentation

## 2018-02-21 DIAGNOSIS — M1990 Unspecified osteoarthritis, unspecified site: Secondary | ICD-10-CM | POA: Diagnosis not present

## 2018-02-21 DIAGNOSIS — Z888 Allergy status to other drugs, medicaments and biological substances status: Secondary | ICD-10-CM | POA: Insufficient documentation

## 2018-02-21 DIAGNOSIS — E039 Hypothyroidism, unspecified: Secondary | ICD-10-CM | POA: Insufficient documentation

## 2018-02-21 DIAGNOSIS — E7800 Pure hypercholesterolemia, unspecified: Secondary | ICD-10-CM | POA: Insufficient documentation

## 2018-02-21 DIAGNOSIS — Z79899 Other long term (current) drug therapy: Secondary | ICD-10-CM | POA: Insufficient documentation

## 2018-02-21 DIAGNOSIS — E119 Type 2 diabetes mellitus without complications: Secondary | ICD-10-CM | POA: Diagnosis not present

## 2018-02-21 DIAGNOSIS — Z9641 Presence of insulin pump (external) (internal): Secondary | ICD-10-CM | POA: Insufficient documentation

## 2018-02-21 DIAGNOSIS — H2511 Age-related nuclear cataract, right eye: Secondary | ICD-10-CM | POA: Diagnosis not present

## 2018-02-21 DIAGNOSIS — I10 Essential (primary) hypertension: Secondary | ICD-10-CM | POA: Diagnosis not present

## 2018-02-21 DIAGNOSIS — Z794 Long term (current) use of insulin: Secondary | ICD-10-CM | POA: Insufficient documentation

## 2018-02-21 HISTORY — PX: CATARACT EXTRACTION W/PHACO: SHX586

## 2018-02-21 LAB — GLUCOSE, CAPILLARY: GLUCOSE-CAPILLARY: 184 mg/dL — AB (ref 65–99)

## 2018-02-21 SURGERY — PHACOEMULSIFICATION, CATARACT, WITH IOL INSERTION
Anesthesia: Monitor Anesthesia Care | Site: Eye | Laterality: Right | Wound class: "Clean "

## 2018-02-21 MED ORDER — FENTANYL CITRATE (PF) 100 MCG/2ML IJ SOLN
INTRAMUSCULAR | Status: AC
  Filled 2018-02-21: qty 2

## 2018-02-21 MED ORDER — NA CHONDROIT SULF-NA HYALURON 40-17 MG/ML IO SOLN
INTRAOCULAR | Status: AC
  Filled 2018-02-21: qty 1

## 2018-02-21 MED ORDER — CARBACHOL 0.01 % IO SOLN
INTRAOCULAR | Status: DC | PRN
  Administered 2018-02-21: .5 mL via INTRAOCULAR

## 2018-02-21 MED ORDER — TRYPAN BLUE 0.06 % OP SOLN
OPHTHALMIC | Status: AC
  Filled 2018-02-21: qty 0.5

## 2018-02-21 MED ORDER — ARMC OPHTHALMIC DILATING DROPS
1.0000 "application " | OPHTHALMIC | Status: AC
  Administered 2018-02-21 (×3): 1 via OPHTHALMIC

## 2018-02-21 MED ORDER — ARMC OPHTHALMIC DILATING DROPS
OPHTHALMIC | Status: AC
  Administered 2018-02-21: 1 via OPHTHALMIC
  Filled 2018-02-21: qty 0.4

## 2018-02-21 MED ORDER — POVIDONE-IODINE 5 % OP SOLN
OPHTHALMIC | Status: AC
  Filled 2018-02-21: qty 30

## 2018-02-21 MED ORDER — SODIUM CHLORIDE 0.9 % IV SOLN
INTRAVENOUS | Status: DC
  Administered 2018-02-21: 06:00:00 via INTRAVENOUS

## 2018-02-21 MED ORDER — BSS IO SOLN
INTRAOCULAR | Status: DC | PRN
  Administered 2018-02-21: 2 mL via OPHTHALMIC

## 2018-02-21 MED ORDER — MIDAZOLAM HCL 2 MG/2ML IJ SOLN
INTRAMUSCULAR | Status: AC
  Filled 2018-02-21: qty 2

## 2018-02-21 MED ORDER — NA CHONDROIT SULF-NA HYALURON 40-17 MG/ML IO SOLN
INTRAOCULAR | Status: DC | PRN
  Administered 2018-02-21: 1 mL via INTRAOCULAR

## 2018-02-21 MED ORDER — POVIDONE-IODINE 5 % OP SOLN
OPHTHALMIC | Status: DC | PRN
  Administered 2018-02-21: 1 via OPHTHALMIC

## 2018-02-21 MED ORDER — EPINEPHRINE PF 1 MG/ML IJ SOLN
INTRAOCULAR | Status: DC | PRN
  Administered 2018-02-21: 1 mL via OPHTHALMIC

## 2018-02-21 MED ORDER — MIDAZOLAM HCL 2 MG/2ML IJ SOLN
INTRAMUSCULAR | Status: DC | PRN
  Administered 2018-02-21: 1 mg via INTRAVENOUS

## 2018-02-21 MED ORDER — MOXIFLOXACIN HCL 0.5 % OP SOLN
OPHTHALMIC | Status: DC | PRN
  Administered 2018-02-21: .2 mL via OPHTHALMIC

## 2018-02-21 MED ORDER — MOXIFLOXACIN HCL 0.5 % OP SOLN
OPHTHALMIC | Status: AC
  Filled 2018-02-21: qty 3

## 2018-02-21 MED ORDER — EPINEPHRINE PF 1 MG/ML IJ SOLN
INTRAMUSCULAR | Status: AC
  Filled 2018-02-21: qty 1

## 2018-02-21 MED ORDER — LIDOCAINE HCL (PF) 4 % IJ SOLN
INTRAMUSCULAR | Status: AC
Start: 2018-02-21 — End: ?
  Filled 2018-02-21: qty 5

## 2018-02-21 MED ORDER — MOXIFLOXACIN HCL 0.5 % OP SOLN: 1.0000 [drp] | OPHTHALMIC | Status: DC | PRN

## 2018-02-21 SURGICAL SUPPLY — 16 items

## 2018-02-21 NOTE — Discharge Instructions (Addendum)
FOLLOW DR. PORFILIO'S POSTOP EYE DROP INSTRUCTION SHEET AS REVIEWED.  Eye Surgery Discharge Instructions  Expect mild scratchy sensation or mild soreness. DO NOT RUB YOUR EYE!  The day of surgery:  Minimal physical activity, but bed rest is not required  No reading, computer work, or close hand work  No bending, lifting, or straining.  May watch TV  For 24 hours:  No driving, legal decisions, or alcoholic beverages  Safety precautions  Eat anything you prefer: It is better to start with liquids, then soup then solid foods. *     Solar shield eyeglasses should be worn for comfort in the sunlight/patch while sleeping  Resume all regular medications including aspirin or Coumadin if these were discontinued prior to surgery. You may shower, bathe, shave, or wash your hair. Tylenol may be taken for mild discomfort.  Call your doctor if you experience significant pain, nausea, or vomiting, fever > 101 or other signs of infection. [phone] or [phone] Specific instructions:  Follow-up Information    Birder Robson, MD Follow up.   Specialty:  Ophthalmology Why:  02/22/18 @ 9:40 am Contact information: [place] [address] [phone]

## 2018-02-21 NOTE — H&P (Signed)
All labs reviewed. Abnormal studies sent to patients PCP when indicated.  Previous H&P reviewed, patient examined, there are NO CHANGES.  Christina Overfield Porfilio5/7/20197:13 AM

## 2018-02-21 NOTE — Anesthesia Preprocedure Evaluation (Signed)
Anesthesia Evaluation  Patient identified by MRN, date of birth, ID band Patient awake    Reviewed: Allergy & Precautions, NPO status , Patient's Chart, lab work & pertinent test results, reviewed documented beta blocker date and time   Airway Mallampati: III  TM Distance: >3 FB     Dental  (+) Chipped   Pulmonary           Cardiovascular hypertension, Pt. on medications      Neuro/Psych    GI/Hepatic   Endo/Other  diabetes, Type 1Hypothyroidism   Renal/GU      Musculoskeletal  (+) Arthritis ,   Abdominal   Peds  Hematology   Anesthesia Other Findings   Reproductive/Obstetrics                             Anesthesia Physical Anesthesia Plan  ASA: III  Anesthesia Plan: MAC   Post-op Pain Management:    Induction:   PONV Risk Score and Plan:   Airway Management Planned:   Additional Equipment:   Intra-op Plan:   Post-operative Plan:   Informed Consent: I have reviewed the patients History and Physical, chart, labs and discussed the procedure including the risks, benefits and alternatives for the proposed anesthesia with the patient or authorized representative who has indicated his/her understanding and acceptance.     Plan Discussed with: CRNA  Anesthesia Plan Comments:         Anesthesia Quick Evaluation

## 2018-02-21 NOTE — Anesthesia Post-op Follow-up Note (Signed)
Anesthesia QCDR form completed.        

## 2018-02-21 NOTE — Op Note (Signed)
PREOPERATIVE DIAGNOSIS:  Nuclear sclerotic cataract of the right eye.   POSTOPERATIVE DIAGNOSIS:  nuclear sclerotic cataract right eye   OPERATIVE PROCEDURE: Procedure(s): CATARACT EXTRACTION PHACO AND INTRAOCULAR LENS PLACEMENT (IOC)   SURGEON:  Birder Robson, MD.   ANESTHESIA:  Anesthesiologist: Gunnar Bulla, MD CRNA: Hedda Slade, CRNA Student Nurse Anesthetist: Carver Fila, RN  1.      Managed anesthesia care. 2.      0.12ml of Shugarcaine was instilled in the eye following the paracentesis.   COMPLICATIONS:  None.   TECHNIQUE:   Stop and chop   DESCRIPTION OF PROCEDURE:  The patient was examined and consented in the preoperative holding area where the aforementioned topical anesthesia was applied to the right eye and then brought back to the Operating Room where the right eye was prepped and draped in the usual sterile ophthalmic fashion and a lid speculum was placed. A paracentesis was created with the side port blade and the anterior chamber was filled with viscoelastic. A near clear corneal incision was performed with the steel keratome. A continuous curvilinear capsulorrhexis was performed with a cystotome followed by the capsulorrhexis forceps. Hydrodissection and hydrodelineation were carried out with BSS on a blunt cannula. The lens was removed in a stop and chop  technique and the remaining cortical material was removed with the irrigation-aspiration handpiece. The capsular bag was inflated with viscoelastic and the Technis [id number]  lens was placed in the capsular bag without complication. The remaining viscoelastic was removed from the eye with the irrigation-aspiration handpiece. The wounds were hydrated. The anterior chamber was flushed with Miostat and the eye was inflated to physiologic pressure. 0.42ml of Vigamox was placed in the anterior chamber. The wounds were found to be water tight. The eye was dressed with Vigamox. The patient was given protective glasses to wear  throughout the day and a shield with which to sleep tonight. The patient was also given drops with which to begin a drop regimen today and will follow-up with me in one day. Implant Name Type Inv. Item Serial No. Manufacturer Lot No. LRB No. Used  LENS IOL DIOP 21.0 - [id number] Intraocular Lens LENS IOL DIOP 21.0 [id number] AMO  Right 1   Procedure(s) with comments: CATARACT EXTRACTION PHACO AND INTRAOCULAR LENS PLACEMENT (IOC) (Right) - [place] 00:46 AP% 10.9 CDE 5.00 Fluid pack lot # [id number]  Electronically signed: Birder Robson 02/21/2018 7:49 AM

## 2018-02-21 NOTE — Anesthesia Postprocedure Evaluation (Signed)
Anesthesia Post Note  Patient: Christina Chen  Procedure(s) Performed: CATARACT EXTRACTION PHACO AND INTRAOCULAR LENS PLACEMENT (IOC) (Right Eye)  Patient location during evaluation: PACU Anesthesia Type: MAC Level of consciousness: awake Pain management: pain level controlled Vital Signs Assessment: post-procedure vital signs reviewed and stable Respiratory status: spontaneous breathing Cardiovascular status: blood pressure returned to baseline Postop Assessment: no apparent nausea or vomiting Anesthetic complications: no     Last Vitals:  Vitals:   02/21/18 0557  BP: (!) 154/72  Pulse: 65  Resp: 16  Temp: (!) 36.2 C  SpO2: 96%    Last Pain:  Vitals:   02/21/18 0557  TempSrc: Temporal  PainSc: 0-No pain                 Carver Fila

## 2018-02-21 NOTE — Transfer of Care (Signed)
Immediate Anesthesia Transfer of Care Note  Patient: Christina Chen  Procedure(s) Performed: CATARACT EXTRACTION PHACO AND INTRAOCULAR LENS PLACEMENT (IOC) (Right Eye)  Patient Location: PACU  Anesthesia Type:MAC  Level of Consciousness: awake, alert  and oriented  Airway & Oxygen Therapy: Patient Spontanous Breathing  Post-op Assessment: Report given to RN and Post -op Vital signs reviewed and stable  Post vital signs: Reviewed and stable  Last Vitals:  Vitals Value Taken Time  BP    Temp    Pulse    Resp    SpO2      Last Pain:  Vitals:   02/21/18 0557  TempSrc: Temporal  PainSc: 0-No pain         Complications: No apparent anesthesia complications

## 2018-02-22 DIAGNOSIS — E1042 Type 1 diabetes mellitus with diabetic polyneuropathy: Secondary | ICD-10-CM | POA: Diagnosis not present

## 2018-02-22 DIAGNOSIS — E538 Deficiency of other specified B group vitamins: Secondary | ICD-10-CM | POA: Diagnosis not present

## 2018-02-22 DIAGNOSIS — E063 Autoimmune thyroiditis: Secondary | ICD-10-CM | POA: Diagnosis not present

## 2018-02-22 DIAGNOSIS — E038 Other specified hypothyroidism: Secondary | ICD-10-CM | POA: Diagnosis not present

## 2018-02-23 DIAGNOSIS — E538 Deficiency of other specified B group vitamins: Secondary | ICD-10-CM | POA: Diagnosis not present

## 2018-02-23 DIAGNOSIS — E039 Hypothyroidism, unspecified: Secondary | ICD-10-CM | POA: Diagnosis not present

## 2018-02-23 DIAGNOSIS — E1042 Type 1 diabetes mellitus with diabetic polyneuropathy: Secondary | ICD-10-CM | POA: Diagnosis not present

## 2018-02-23 DIAGNOSIS — E1069 Type 1 diabetes mellitus with other specified complication: Secondary | ICD-10-CM | POA: Diagnosis not present

## 2018-02-23 DIAGNOSIS — E785 Hyperlipidemia, unspecified: Secondary | ICD-10-CM | POA: Diagnosis not present

## 2018-03-23 DIAGNOSIS — I10 Essential (primary) hypertension: Secondary | ICD-10-CM | POA: Diagnosis not present

## 2018-03-23 DIAGNOSIS — L400 Psoriasis vulgaris: Secondary | ICD-10-CM | POA: Diagnosis not present

## 2018-03-23 DIAGNOSIS — E7800 Pure hypercholesterolemia, unspecified: Secondary | ICD-10-CM | POA: Diagnosis not present

## 2018-03-30 DIAGNOSIS — E669 Obesity, unspecified: Secondary | ICD-10-CM | POA: Diagnosis not present

## 2018-03-30 DIAGNOSIS — Z0000 Encounter for general adult medical examination without abnormal findings: Secondary | ICD-10-CM | POA: Diagnosis not present

## 2018-03-30 DIAGNOSIS — E559 Vitamin D deficiency, unspecified: Secondary | ICD-10-CM | POA: Diagnosis not present

## 2018-03-30 DIAGNOSIS — E785 Hyperlipidemia, unspecified: Secondary | ICD-10-CM | POA: Diagnosis not present

## 2018-03-30 DIAGNOSIS — E1069 Type 1 diabetes mellitus with other specified complication: Secondary | ICD-10-CM | POA: Diagnosis not present

## 2018-03-30 DIAGNOSIS — I10 Essential (primary) hypertension: Secondary | ICD-10-CM | POA: Diagnosis not present

## 2018-04-06 DIAGNOSIS — E10649 Type 1 diabetes mellitus with hypoglycemia without coma: Secondary | ICD-10-CM | POA: Diagnosis not present

## 2018-04-06 DIAGNOSIS — E1042 Type 1 diabetes mellitus with diabetic polyneuropathy: Secondary | ICD-10-CM | POA: Diagnosis not present

## 2018-04-07 DIAGNOSIS — E1042 Type 1 diabetes mellitus with diabetic polyneuropathy: Secondary | ICD-10-CM | POA: Diagnosis not present

## 2018-04-07 DIAGNOSIS — E10649 Type 1 diabetes mellitus with hypoglycemia without coma: Secondary | ICD-10-CM | POA: Diagnosis not present

## 2018-04-17 DIAGNOSIS — M898X1 Other specified disorders of bone, shoulder: Secondary | ICD-10-CM | POA: Diagnosis not present

## 2018-05-04 DIAGNOSIS — Z8371 Family history of colonic polyps: Secondary | ICD-10-CM | POA: Diagnosis not present

## 2018-05-04 DIAGNOSIS — Z86010 Personal history of colonic polyps: Secondary | ICD-10-CM | POA: Diagnosis not present

## 2018-05-24 DIAGNOSIS — M79671 Pain in right foot: Secondary | ICD-10-CM | POA: Diagnosis not present

## 2018-05-24 DIAGNOSIS — D2372 Other benign neoplasm of skin of left lower limb, including hip: Secondary | ICD-10-CM | POA: Diagnosis not present

## 2018-05-24 DIAGNOSIS — D1723 Benign lipomatous neoplasm of skin and subcutaneous tissue of right leg: Secondary | ICD-10-CM | POA: Diagnosis not present

## 2018-05-24 DIAGNOSIS — M79672 Pain in left foot: Secondary | ICD-10-CM | POA: Diagnosis not present

## 2018-06-01 DIAGNOSIS — E538 Deficiency of other specified B group vitamins: Secondary | ICD-10-CM | POA: Diagnosis not present

## 2018-06-01 DIAGNOSIS — E039 Hypothyroidism, unspecified: Secondary | ICD-10-CM | POA: Diagnosis not present

## 2018-06-01 DIAGNOSIS — E785 Hyperlipidemia, unspecified: Secondary | ICD-10-CM | POA: Diagnosis not present

## 2018-06-01 DIAGNOSIS — E1042 Type 1 diabetes mellitus with diabetic polyneuropathy: Secondary | ICD-10-CM | POA: Diagnosis not present

## 2018-06-01 DIAGNOSIS — E1069 Type 1 diabetes mellitus with other specified complication: Secondary | ICD-10-CM | POA: Diagnosis not present

## 2018-06-14 DIAGNOSIS — D2372 Other benign neoplasm of skin of left lower limb, including hip: Secondary | ICD-10-CM | POA: Diagnosis not present

## 2018-06-14 DIAGNOSIS — M79671 Pain in right foot: Secondary | ICD-10-CM | POA: Diagnosis not present

## 2018-06-14 DIAGNOSIS — M79672 Pain in left foot: Secondary | ICD-10-CM | POA: Diagnosis not present

## 2018-06-15 ENCOUNTER — Other Ambulatory Visit: Payer: Self-pay

## 2018-06-15 NOTE — Patient Outreach (Signed)
[place]) Care Management  06/15/2018  IEESHA ABBASI [birth year] [id number]   Telephone Screen  Referral Date: 06/15/18 Referral Source: [employer]TA Concierge Referral Reason: " member requesting assistance with Rx costs" Insurance: HTA   Outreach attempt # 1 to patient. No answer at present. RN CM left HIPAA compliant voicemail message along with contact info.     Plan: RN CM will make outreach attempt to patient within 3-4 business days. RN CM will send unsuccessful outreach letter to patient.    Enzo Montgomery, RN,BSN,CCM [place] Management Telephonic Care Management Coordinator Direct Phone: [phone] Toll Free: [phone] Fax: [phone]

## 2018-06-16 ENCOUNTER — Other Ambulatory Visit: Payer: Self-pay

## 2018-06-16 DIAGNOSIS — E1042 Type 1 diabetes mellitus with diabetic polyneuropathy: Secondary | ICD-10-CM | POA: Diagnosis not present

## 2018-06-16 DIAGNOSIS — E10649 Type 1 diabetes mellitus with hypoglycemia without coma: Secondary | ICD-10-CM | POA: Diagnosis not present

## 2018-06-16 NOTE — Patient Outreach (Addendum)
[facility]) Care Management  06/16/2018  Christina Chen 1946-03-21 [id number]   Telephone Screen  Referral Date: 06/15/18 Referral Source: HTA Concierge Referral Reason: " member requesting assistance with Rx costs" Insurance: HTA    Outreach attempt #2 to patient. Spoke with spouse. Screening completed.  Social: Patient resides in her home along with her spouse. She voices that she is independent with ADLS/IADLs. Patient denies any recent falls. She states she does not use any assistive devices. She drives herself to appts.  Conditions: Per chart review, patient has PMH of HTN, cataracts and DM. She voices that she has been a diabetic for several years. She is using insulin pump. Patient voices that cbgs range in the 120s-130s. She denies needing any further education or support in managing chronic conditions.    Medications: Per patient she is taking about seven meds. She reports that her only med that she is having trouble affording is her Novolog insulin for pump.  Appointments: Patient followed by and sees PCP regularly.  Advance Directives: She voices that her and her spouse are in the process of completing paperwork at attorney office.   Consent: [facility] services reviewed and discussed with patient. She gave verbal consent for services.       Plan: RN CM will send [facility] pharmacy referral for possible med assistance.   Enzo Montgomery, RN,BSN,CCM [place] Management Telephonic Care Management Coordinator Direct Phone: [phone] Toll Free: [phone] Fax: [phone]

## 2018-06-21 ENCOUNTER — Ambulatory Visit: Payer: Self-pay

## 2018-06-23 ENCOUNTER — Other Ambulatory Visit: Payer: Self-pay | Admitting: Pharmacist

## 2018-06-23 NOTE — Patient Outreach (Signed)
[name] Care Management  06/23/2018  Christina Chen 05-Nov-1945 [id number]   72 year old female referred to [place] Management by HTA Concierge for medication assistance.  PMHx includes, but not limited to, diabetes mellitus (unclear Type 1 vs 2 per review of EMR) on insulin pump, anemia, anxiety, hypothyroidism, HLD, and osteoporosis.    Unsuccessful call placed to [name] today.  I left a HIPAA compliant voicemail on the mobile phone.  The home phone rang and then was busy, no option to leave voicemail.   Plan: I will send patient a letter describing [facility] services and will follow-up with her again next week regarding medication assistance.   [name], PharmD, [place] [phone]

## 2018-06-28 ENCOUNTER — Ambulatory Visit: Payer: Self-pay | Admitting: Pharmacist

## 2018-06-28 ENCOUNTER — Other Ambulatory Visit: Payer: Self-pay | Admitting: Pharmacist

## 2018-06-28 NOTE — Patient Outreach (Addendum)
[facility]) Care Management  06/28/2018  Christina Chen 1946/06/22 [id number]  72 year old female referred to [place] Management by HTA Concierge for medication assistance.  PMHx includes, but not limited to, diabetes mellitus type II on insulin pump, anemia, anxiety, hypothyroidism, HLD, and osteoporosis.    Unsuccessful call attempt #2 placed to Christina Chen today.  I left a HIPAA compliant voicemail on the home phone.  There was no voicemail set up on the mobile phone to leave a message.   Plan: I will follow-up with Christina Chen in 3-4 business days unless I hear back beforehand.   [name], PharmD, [place] [phone]   Addendum: Incoming call received from Christina Chen.  HIPAA identifiers verified. Patient reports that she is in the coverage gap and is having difficulty affording insulin.  She reports she uses Novolog vials and 1 vial lasts ~1 week.  Objective: Hemoglobin A1C = 8.3 (02/22/18) SCr = 0.83 (12/28/17)  Medications Reviewed Today    Reviewed by Rudean Haskell, RPH (Pharmacist) on 06/28/18 at 1623  Med List Status: <None>  Medication Order Taking? Sig Documenting Provider Last Dose Status Informant  aspirin EC 81 MG tablet [id number] Yes Take 81 mg by mouth daily. [provider] Taking Active Self  Calcium-Magnesium-Vitamin D (CALCIUM 1200+D3 PO) [id number] Yes Take 1 tablet by mouth daily. [provider] Taking Active Self  cyanocobalamin 1000 MCG tablet [id number] Yes Take 1,000 mcg by mouth daily. [provider] Taking Active   enalapril (VASOTEC) 2.5 MG tablet [id number] Yes Take 2.5 mg by mouth daily. [provider] Taking Active Self  ferrous sulfate 325 (65 FE) MG EC tablet [id number] Yes Take 325 mg by mouth at bedtime. [provider] Taking Active Self  folic acid (FOLVITE) 1 MG tablet [id number] Yes Take 1 mg by mouth daily. [provider]  Taking Active Self  levothyroxine (SYNTHROID, LEVOTHROID) 88 MCG tablet [id number] Yes Take 88 mcg by mouth See admin instructions. Take 88 mcg by mouth daily except DO NOT take on Sunday [provider] Taking Active Self  methotrexate (RHEUMATREX) 2.5 MG tablet [id number] Yes Take 15 mg by mouth every Wednesday. Caution:Chemotherapy. Protect from light.  [provider] Taking Active Self  NOVOLOG 100 UNIT/ML injection [id number] Yes 70 Units by Other route See admin instructions. Use via insulin pump max of 70 units daily [provider] Taking Active Self  rosuvastatin (CRESTOR) 5 MG tablet [id number] Yes Take 5 mg by mouth daily. [provider] Taking Active Self  Vitamin D, Ergocalciferol, (DRISDOL) 50000 units CAPS capsule [id number] Yes Take 50,000 Units by mouth every 30 (thirty) days. [provider] Taking Active Self         Assessment: Drugs sorted by system:  Cardiovascular: aspirin 81mg , enalapril, rosuvastatin  Endocrine: levothyroxine, novolog  Vitamins/Minerals: Calcium-Mg-vitamin D, vitamin B12, vitamin D monthly, folic acid  Hematologic: ferrous sulfate  Miscellaneous: Methotrexate  Medication assistance: Per HTA records, patient TROOP = $1,121.94 since 06/23/2018.  Extra Help: Patient does not qualify based on reported income  PAPs:  Novo-Care, patient meets income and TROOP requirements.  Will proceed with application.  I reviewed application documents needed to submit.  Patient voiced understanding.    Plan: I will route patient assistance letter to pharmacy technician, Etter Sjogren, who will coordinate application process for Novolog through [facility].  She will assist with obtaining all pertinent documents from both patient and Dr. Adella Hare and submit application  once completed.   [name], PharmD, [place] [phone]

## 2018-06-28 NOTE — Addendum Note (Signed)
Addended by: Rudean Haskell on: 06/28/2018 04:32 PM   Modules accepted: Orders

## 2018-06-29 ENCOUNTER — Encounter: Payer: Self-pay | Admitting: *Deleted

## 2018-06-29 ENCOUNTER — Other Ambulatory Visit: Payer: Self-pay | Admitting: Family Medicine

## 2018-06-29 ENCOUNTER — Other Ambulatory Visit: Payer: Self-pay | Admitting: Pharmacy Technician

## 2018-06-29 DIAGNOSIS — Z1231 Encounter for screening mammogram for malignant neoplasm of breast: Secondary | ICD-10-CM

## 2018-06-29 NOTE — Patient Outreach (Signed)
[facility]) Care Management  06/29/2018  Christina Chen 16-Jul-1946 [id number]   Received Novo Nordisk patient assistance referral from [place] for [employer]. Prepared patient portion to be mailed and faxed provider portion to Dr. Eddie Dibbles.  Will follow up with patient in 7-10 business days to confirm application has been received.  [name] [place], [place] Management [phone]

## 2018-06-30 ENCOUNTER — Ambulatory Visit: Payer: PPO | Admitting: Anesthesiology

## 2018-06-30 ENCOUNTER — Ambulatory Visit
Admission: RE | Admit: 2018-06-30 | Discharge: 2018-06-30 | Disposition: A | Discharge status: Home / Self Care | Payer: PPO | Source: Ambulatory Visit | Attending: Unknown Physician Specialty | Admitting: Unknown Physician Specialty

## 2018-06-30 ENCOUNTER — Other Ambulatory Visit: Payer: Self-pay

## 2018-06-30 ENCOUNTER — Encounter
Admission: RE | Disposition: A | Discharge status: Home / Self Care | Payer: Self-pay | Source: Ambulatory Visit | Attending: Unknown Physician Specialty

## 2018-06-30 DIAGNOSIS — Z794 Long term (current) use of insulin: Secondary | ICD-10-CM | POA: Diagnosis not present

## 2018-06-30 DIAGNOSIS — Z86010 Personal history of colonic polyps: Secondary | ICD-10-CM | POA: Diagnosis not present

## 2018-06-30 DIAGNOSIS — M810 Age-related osteoporosis without current pathological fracture: Secondary | ICD-10-CM | POA: Insufficient documentation

## 2018-06-30 DIAGNOSIS — K640 First degree hemorrhoids: Secondary | ICD-10-CM | POA: Diagnosis not present

## 2018-06-30 DIAGNOSIS — K648 Other hemorrhoids: Secondary | ICD-10-CM | POA: Diagnosis not present

## 2018-06-30 DIAGNOSIS — M1990 Unspecified osteoarthritis, unspecified site: Secondary | ICD-10-CM | POA: Insufficient documentation

## 2018-06-30 DIAGNOSIS — F419 Anxiety disorder, unspecified: Secondary | ICD-10-CM | POA: Insufficient documentation

## 2018-06-30 DIAGNOSIS — Z888 Allergy status to other drugs, medicaments and biological substances status: Secondary | ICD-10-CM | POA: Insufficient documentation

## 2018-06-30 DIAGNOSIS — E109 Type 1 diabetes mellitus without complications: Secondary | ICD-10-CM | POA: Diagnosis not present

## 2018-06-30 DIAGNOSIS — Z9641 Presence of insulin pump (external) (internal): Secondary | ICD-10-CM | POA: Insufficient documentation

## 2018-06-30 DIAGNOSIS — Z79899 Other long term (current) drug therapy: Secondary | ICD-10-CM | POA: Diagnosis not present

## 2018-06-30 DIAGNOSIS — Z7982 Long term (current) use of aspirin: Secondary | ICD-10-CM | POA: Insufficient documentation

## 2018-06-30 DIAGNOSIS — Z1211 Encounter for screening for malignant neoplasm of colon: Secondary | ICD-10-CM | POA: Diagnosis not present

## 2018-06-30 DIAGNOSIS — E538 Deficiency of other specified B group vitamins: Secondary | ICD-10-CM | POA: Insufficient documentation

## 2018-06-30 DIAGNOSIS — E039 Hypothyroidism, unspecified: Secondary | ICD-10-CM | POA: Diagnosis not present

## 2018-06-30 DIAGNOSIS — I10 Essential (primary) hypertension: Secondary | ICD-10-CM | POA: Diagnosis not present

## 2018-06-30 DIAGNOSIS — E785 Hyperlipidemia, unspecified: Secondary | ICD-10-CM | POA: Diagnosis not present

## 2018-06-30 DIAGNOSIS — Z8249 Family history of ischemic heart disease and other diseases of the circulatory system: Secondary | ICD-10-CM | POA: Insufficient documentation

## 2018-06-30 DIAGNOSIS — E7800 Pure hypercholesterolemia, unspecified: Secondary | ICD-10-CM | POA: Diagnosis not present

## 2018-06-30 DIAGNOSIS — E119 Type 2 diabetes mellitus without complications: Secondary | ICD-10-CM | POA: Diagnosis not present

## 2018-06-30 HISTORY — PX: COLONOSCOPY WITH PROPOFOL: SHX5780

## 2018-06-30 HISTORY — DX: Anxiety disorder, unspecified: F41.9

## 2018-06-30 HISTORY — DX: Unilateral primary osteoarthritis, left knee: M17.12

## 2018-06-30 HISTORY — DX: Anemia, unspecified: D64.9

## 2018-06-30 HISTORY — DX: Hypoglycemia, unspecified: E16.2

## 2018-06-30 HISTORY — DX: Disorder of thyroid, unspecified: E07.9

## 2018-06-30 HISTORY — DX: Age-related osteoporosis without current pathological fracture: M81.0

## 2018-06-30 HISTORY — DX: Hyperlipidemia, unspecified: E78.5

## 2018-06-30 HISTORY — DX: Deficiency of other specified B group vitamins: E53.8

## 2018-06-30 LAB — GLUCOSE, CAPILLARY: GLUCOSE-CAPILLARY: 111 mg/dL — AB (ref 70–99)

## 2018-06-30 SURGERY — COLONOSCOPY WITH PROPOFOL
Anesthesia: General

## 2018-06-30 MED ORDER — PROPOFOL 500 MG/50ML IV EMUL
INTRAVENOUS | Status: AC
  Filled 2018-06-30: qty 50

## 2018-06-30 MED ORDER — PROPOFOL 10 MG/ML IV BOLUS
INTRAVENOUS | Status: DC | PRN
  Administered 2018-06-30: 100 mg via INTRAVENOUS

## 2018-06-30 MED ORDER — SODIUM CHLORIDE 0.9 % IV SOLN
INTRAVENOUS | Status: DC
  Administered 2018-06-30: 08:00:00 via INTRAVENOUS

## 2018-06-30 MED ORDER — SODIUM CHLORIDE 0.9 % IV SOLN: INTRAVENOUS | Status: DC

## 2018-06-30 MED ORDER — PROPOFOL 500 MG/50ML IV EMUL
INTRAVENOUS | Status: DC | PRN
  Administered 2018-06-30: 75 ug/kg/min via INTRAVENOUS

## 2018-06-30 NOTE — Transfer of Care (Signed)
Immediate Anesthesia Transfer of Care Note  Patient: Christina Chen  Procedure(s) Performed: COLONOSCOPY WITH PROPOFOL (N/A )  Patient Location: PACU and Endoscopy Unit  Anesthesia Type:General  Level of Consciousness: awake  Airway & Oxygen Therapy: Patient Spontanous Breathing and Patient connected to nasal cannula oxygen  Post-op Assessment: Report given to RN and Post -op Vital signs reviewed and stable  Post vital signs: Reviewed and stable  Last Vitals:  Vitals Value Taken Time  BP 109/50 06/30/2018  8:08 AM  Temp 36.1 C 06/30/2018  8:08 AM  Pulse 52 06/30/2018  8:10 AM  Resp 15 06/30/2018  8:10 AM  SpO2 100 % 06/30/2018  8:10 AM  Vitals shown include unvalidated device data.  Last Pain:  Vitals:   06/30/18 0808  TempSrc:   PainSc: 0-No pain         Complications: No apparent anesthesia complications

## 2018-06-30 NOTE — Anesthesia Postprocedure Evaluation (Signed)
Anesthesia Post Note  Patient: Christina Chen  Procedure(s) Performed: COLONOSCOPY WITH PROPOFOL (N/A )  Anesthesia Type: General     Last Vitals:  Vitals:   06/30/18 0714 06/30/18 0808  BP: (!) 139/102 (!) 109/50  Pulse: (!) 58 (!) 57  Resp: 18   Temp: (!) 36.3 C (!) 36.1 C  SpO2: 98%     Last Pain:  Vitals:   06/30/18 0808  TempSrc:   PainSc: 0-No pain                 Babs Sciara

## 2018-06-30 NOTE — Anesthesia Preprocedure Evaluation (Signed)
Anesthesia Evaluation  Patient identified by MRN, date of birth, ID band Patient awake    Reviewed: Allergy & Precautions, NPO status , Patient's Chart, lab work & pertinent test results  History of Anesthesia Complications Negative for: history of anesthetic complications  Airway Mallampati: III       Dental   Pulmonary neg sleep apnea, neg COPD,           Cardiovascular (-) hypertension(-) Past MI and (-) CHF (-) dysrhythmias (-) Valvular Problems/Murmurs     Neuro/Psych neg Seizures Anxiety    GI/Hepatic Neg liver ROS, neg GERD  ,  Endo/Other  diabetes, Type 2, Oral Hypoglycemic AgentsHypothyroidism   Renal/GU negative Renal ROS     Musculoskeletal   Abdominal   Peds  Hematology  (+) anemia ,   Anesthesia Other Findings   Reproductive/Obstetrics                             Anesthesia Physical Anesthesia Plan  ASA: III  Anesthesia Plan: General   Post-op Pain Management:    Induction: Intravenous  PONV Risk Score and Plan: 3 and Propofol infusion, TIVA and Treatment may vary due to age or medical condition  Airway Management Planned: Nasal Cannula  Additional Equipment:   Intra-op Plan:   Post-operative Plan:   Informed Consent: I have reviewed the patients History and Physical, chart, labs and discussed the procedure including the risks, benefits and alternatives for the proposed anesthesia with the patient or authorized representative who has indicated his/her understanding and acceptance.     Plan Discussed with:   Anesthesia Plan Comments:         Anesthesia Quick Evaluation

## 2018-06-30 NOTE — Anesthesia Postprocedure Evaluation (Signed)
Anesthesia Post Note  Patient: Christina Chen  Procedure(s) Performed: COLONOSCOPY WITH PROPOFOL (N/A )  Patient location during evaluation: Endoscopy Anesthesia Type: General Level of consciousness: awake and alert Pain management: pain level controlled Vital Signs Assessment: post-procedure vital signs reviewed and stable Respiratory status: spontaneous breathing and respiratory function stable Cardiovascular status: stable Anesthetic complications: no     Last Vitals:  Vitals:   06/30/18 0714 06/30/18 0808  BP: (!) 139/102 (!) 109/50  Pulse: (!) 58 (!) 57  Resp: 18   Temp: (!) 36.3 C (!) 36.1 C  SpO2: 98%     Last Pain:  Vitals:   06/30/18 0830  TempSrc:   PainSc: 0-No pain                 KEPHART,WILLIAM K

## 2018-06-30 NOTE — H&P (Signed)
Primary Care Physician:  Dion Body, MD Primary Gastroenterologist:  Dr. Vira Agar  Pre-Procedure History & Physical: HPI:  Christina Chen is a 72 y.o. female is here for an colonoscopy.Done for personal history of colon polyps.   Past Medical History:  Diagnosis Date  . Anemia   . Anxiety   . Arthritis   . B12 deficiency   . Degenerative joint disease of knee, left   . Diabetes mellitus without complication (HCC)    Type I insulin pump  . Hypercholesterolemia   . Hyperlipidemia   . Hypertension   . Hypoglycemia   . Hypothyroidism   . Osteoporosis   . Psoriasis   . Thyroid disease     Past Surgical History:  Procedure Laterality Date  . APPENDECTOMY    . CATARACT EXTRACTION W/PHACO Left 01/24/2018   Procedure: CATARACT EXTRACTION PHACO AND INTRAOCULAR LENS PLACEMENT (IOC);  Surgeon: Birder Robson, MD;  Location: [facility] ORS;  Service: Ophthalmology;  Laterality: Left;  [place] 01:28 AP% 15.3 CDE 13.61 Fluid pack lot # [id number]  . CATARACT EXTRACTION W/PHACO Right 02/21/2018   Procedure: CATARACT EXTRACTION PHACO AND INTRAOCULAR LENS PLACEMENT (IOC);  Surgeon: Birder Robson, MD;  Location: [facility] ORS;  Service: Ophthalmology;  Laterality: Right;  [place] 00:46 AP% 10.9 CDE 5.00 Fluid pack lot # [id number]  . CHOLECYSTECTOMY    . COLONOSCOPY    . FOOT SURGERY    . TONSILLECTOMY      Prior to Admission medications   Medication Sig Start Date End Date Taking? Authorizing Provider  acetaminophen (TYLENOL) 500 MG tablet Take 500 mg by mouth as needed.   Yes [provider]  aspirin EC 81 MG tablet Take 81 mg by mouth daily.   Yes [provider]  calcium carbonate (OSCAL) 1500 (600 Ca) MG TABS tablet Take by mouth daily.   Yes [provider]  Calcium-Magnesium-Vitamin D (CALCIUM 1200+D3 PO) Take 1 tablet by mouth daily.   Yes [provider]  cyanocobalamin 1000 MCG tablet Take 1,000 mcg by mouth daily.   Yes [provider]   enalapril (VASOTEC) 2.5 MG tablet Take 2.5 mg by mouth daily.   Yes [provider]  ferrous sulfate 325 (65 FE) MG EC tablet Take 325 mg by mouth at bedtime.   Yes [provider]  levothyroxine (SYNTHROID, LEVOTHROID) 88 MCG tablet Take 88 mcg by mouth See admin instructions. Take 88 mcg by mouth daily except DO NOT take on Sunday   Yes [provider]  Loratadine (CLARITIN) 10 MG CAPS Take 10 mg by mouth as needed.   Yes [provider]  methotrexate (RHEUMATREX) 2.5 MG tablet Take 15 mg by mouth every Wednesday. Caution:Chemotherapy. Protect from light.    Yes [provider]  rosuvastatin (CRESTOR) 5 MG tablet Take 5 mg by mouth daily.   Yes [provider]  Vitamin D, Ergocalciferol, (DRISDOL) 50000 units CAPS capsule Take 50,000 Units by mouth every 30 (thirty) days.   Yes [provider]  folic acid (FOLVITE) 1 MG tablet Take 1 mg by mouth daily.    [provider]  guaiFENesin (HERBAL EXPEC PO) Take 1,200 mg by mouth.    [provider]  Menthol, Topical Analgesic, 2 % GEL Apply topically as needed.    [provider]  NOVOLOG 100 UNIT/ML injection 70 Units by Other route See admin instructions. Use via insulin pump max of 70 units daily 12/28/17   [provider]  TURMERIC PO Take by  mouth daily.    [provider]    Allergies as of 05/22/2018 - Review Complete 02/21/2018  Allergen Reaction Noted  . Cisapride Nausea And Vomiting 01/31/2014  . Oxycodone Other (See Comments) 01/05/2018  . Tramadol Other (See Comments) 01/05/2018    Family History  Problem Relation Age of Onset  . Breast cancer Other 40  . Diabetes Father   . Stroke Father   . Cancer Sister   . Lymphoma Sister   . Colon polyps Sister   . Diabetes Brother   . Coronary artery disease Brother     Social History   Socioeconomic History  . Marital status: Married    Spouse name: Not on file  . Number  of children: Not on file  . Years of education: Not on file  . Highest education level: Not on file  Occupational History  . Not on file  Social Needs  . Financial resource strain: Not on file  . Food insecurity:    Worry: Not on file    Inability: Not on file  . Transportation needs:    Medical: Not on file    Non-medical: Not on file  Tobacco Use  . Smoking status: Never Smoker  . Smokeless tobacco: Never Used  Substance and Sexual Activity  . Alcohol use: Not Currently  . Drug use: Not on file  . Sexual activity: Not on file  Lifestyle  . Physical activity:    Days per week: Not on file    Minutes per session: Not on file  . Stress: Not on file  Relationships  . Social connections:    Talks on phone: Not on file    Gets together: Not on file    Attends religious service: Not on file    Active member of club or organization: Not on file    Attends meetings of clubs or organizations: Not on file    Relationship status: Not on file  . Intimate partner violence:    Fear of current or ex partner: Not on file    Emotionally abused: Not on file    Physically abused: Not on file    Forced sexual activity: Not on file  Other Topics Concern  . Not on file  Social History Narrative  . Not on file    Review of Systems: See HPI, otherwise negative ROS  Physical Exam: BP (!) 139/102   Pulse (!) 58   Temp (!) 97.4 F (36.3 C) (Tympanic)   Resp 18   Ht 5\' 1"  (1.549 m)   Wt 72.1 kg   SpO2 98%   BMI 30.04 kg/m  General:   Alert,  pleasant and cooperative in NAD Head:  Normocephalic and atraumatic. Neck:  Supple; no masses or thyromegaly. Lungs:  Clear throughout to auscultation.    Heart:  Regular rate and rhythm. Abdomen:  Soft, nontender and nondistended. Normal bowel sounds, without guarding, and without rebound.   Neurologic:  Alert and  oriented x4;  grossly normal neurologically.  Impression/Plan: Christina Chen is here for an colonoscopy to be performed  for [facility] colon polyps.  Risks, benefits, limitations, and alternatives regarding  colonoscopy have been reviewed with the patient.  Questions have been answered.  All parties agreeable.   Gaylyn Cheers, MD  06/30/2018, 7:33 AM

## 2018-06-30 NOTE — Op Note (Signed)
[facility] Patient Name: Christina Chen Procedure Date: 06/30/2018 7:36 AM MRN: [id number] Account #: [id number] Date of Birth: [birth year] Admit Type: Outpatient Age: 72 Room: [facility] ENDO ROOM 3 Gender: Female Note Status: Finalized Procedure:            Colonoscopy Indications:          High risk colon cancer surveillance: Personal history                        of colonic polyps Providers:            Manya Silvas, MD Referring MD:         Dion Body (Referring MD) Medicines:            Propofol per Anesthesia Complications:        No immediate complications. Procedure:            Pre-Anesthesia Assessment:                       - After reviewing the risks and benefits, the patient                        was deemed in satisfactory condition to undergo the                        procedure.                       After obtaining informed consent, the colonoscope was                        passed under direct vision. Throughout the procedure,                        the patient's blood pressure, pulse, and oxygen                        saturations were monitored continuously. The                        Colonoscope was introduced through the anus and                        advanced to the the cecum, identified by appendiceal                        orifice and ileocecal valve. The colonoscopy was                        performed without difficulty. The patient tolerated the                        procedure well. The quality of the bowel preparation                        was excellent. Findings:      Internal hemorrhoids were found during endoscopy. The hemorrhoids were       small and Grade I (internal hemorrhoids that do not prolapse).      The exam was otherwise without abnormality. Impression:           - Internal hemorrhoids.                       -  The examination was otherwise normal.                       - No specimens  collected. Recommendation:       - The findings and recommendations were discussed with                        the patient. Consider any further procedures in the                        future or not. Due to no significant polyps in last 10                        years I would not recommend a repeat colonoscopy. Manya Silvas, MD 06/30/2018 8:08:25 AM This report has been signed electronically. Number of Addenda: 0 Note Initiated On: 06/30/2018 7:36 AM Scope Withdrawal Time: 0 hours 9 minutes 6 seconds  Total Procedure Duration: 0 hours 23 minutes 37 seconds       [facility]

## 2018-06-30 NOTE — Anesthesia Post-op Follow-up Note (Signed)
Anesthesia QCDR form completed.        

## 2018-07-03 ENCOUNTER — Encounter: Payer: Self-pay | Admitting: Unknown Physician Specialty

## 2018-07-03 DIAGNOSIS — M898X1 Other specified disorders of bone, shoulder: Secondary | ICD-10-CM | POA: Diagnosis not present

## 2018-07-04 ENCOUNTER — Ambulatory Visit: Payer: Self-pay | Admitting: Pharmacist

## 2018-07-04 DIAGNOSIS — L400 Psoriasis vulgaris: Secondary | ICD-10-CM | POA: Diagnosis not present

## 2018-07-05 DIAGNOSIS — L400 Psoriasis vulgaris: Secondary | ICD-10-CM | POA: Diagnosis not present

## 2018-07-06 ENCOUNTER — Other Ambulatory Visit: Payer: Self-pay | Admitting: Pharmacy Technician

## 2018-07-06 NOTE — Patient Outreach (Signed)
[place] [facility]) Care Management  07/06/2018  Christina Chen 1946/10/13 [id number]   Incoming call from [name] in regards to receiving her patient assistance application. Mrs. Grima had questions about what documents she needed to provide for it. I informed her that we would only need her and her husbands proof of income.  Will follow up with in 10-14 business days if application has not been returned.  [name] [place], [place] Management [phone]

## 2018-07-10 DIAGNOSIS — E10649 Type 1 diabetes mellitus with hypoglycemia without coma: Secondary | ICD-10-CM | POA: Diagnosis not present

## 2018-07-10 DIAGNOSIS — E1042 Type 1 diabetes mellitus with diabetic polyneuropathy: Secondary | ICD-10-CM | POA: Diagnosis not present

## 2018-07-11 ENCOUNTER — Other Ambulatory Visit: Payer: Self-pay | Admitting: Pharmacy Technician

## 2018-07-11 NOTE — Patient Outreach (Signed)
[place] [facility]) Care Management  07/11/2018  AMBERLYNN TEMPESTA 1946-07-20 [id number]   Call attempt to Mrs. Broski, Mr. Rauen answered the phone, HIPAA identifiers verified. Mr. Meadowcroft is unsure if his applications had been received. I left a message to have him Mrs. Mausolf call me back so that I can verify if applications had been received, also to inform her that I received her application back but 3rd page with a place for signature is missing. I also need her to provide proof of Mr. Greenly' income.  Will make 2nd cal attempt in 2-3 business days, if call has not been returned.  [name] [place], [place] Management [phone]

## 2018-07-12 ENCOUNTER — Other Ambulatory Visit: Payer: Self-pay | Admitting: Pharmacy Technician

## 2018-07-12 NOTE — Patient Outreach (Addendum)
[place] [facility]) Care Management  07/12/2018  Christina Chen [birth year] [id number]   Successful call attempt to Christina Chen, HIPAA identifiers verified. I asked Christina Chen if the application for Christina Chen had been received and she wasn't "quite sure". I also informed her that I received her patient assistance application for Novolog however, the last page of application was missing that has a place she needs to sign. I also informed her that I would still need proof of [name]' income as well. Christina Chen stated that she was going to go through paper work and mail that she has received this week and will call me back to let me know if they had received [name]' applications and if she come across the last page of hers.  Will call patient back in 2-3 days if she hasn't called me back.  [name] [place], [place] Management [phone]    ADDENDUM 12:15pm  Incoming call from Christina Chen. Christina Chen thinks that her husbands applications have been misplaced. Prepared new signature portions of Christina Chen and Christina Chen as well as included 3rd page of Christina Chen application that Christina Chen needs to sign to be mailed.  Will follow up with patient in 5-7 business days to confirm applications have been received.  [name] [place], [place] Management [phone]

## 2018-07-24 ENCOUNTER — Other Ambulatory Visit: Payer: Self-pay | Admitting: Pharmacist

## 2018-07-24 NOTE — Patient Outreach (Signed)
[place] [facility]) Care Management  07/24/2018  Christina Chen [birth year] [id number]  Reason for consult:  Medication assistance  Successful telephone call to [name].  Patient reports application and financial documents received from [facility] and returned to post office on 07/21/2018.    Plan: Follow-up that applications received by [facility] and submitted to patient assistance companies.    [name], PharmD, [place] [phone]

## 2018-07-25 ENCOUNTER — Other Ambulatory Visit: Payer: Self-pay | Admitting: Pharmacy Technician

## 2018-07-25 NOTE — Patient Outreach (Signed)
[place] [facility]) Care Management  07/25/2018  Christina Chen 06-23-1946 [id number]   Received patient portion of application for Novolog. Faxed completed application and required documents into [employer].   Will follow up with company in 2-3 business days to check status of application.  [name] [place], [place] Management [phone]

## 2018-07-28 ENCOUNTER — Other Ambulatory Visit: Payer: Self-pay | Admitting: Pharmacy Technician

## 2018-07-28 NOTE — Patient Outreach (Signed)
[place] [facility]) Care Management  07/28/2018  Christina Chen June 20, 1946 [id number]    Successful call to Wells Guiles, HIPAA identifiers verified. Informed her [employer] requires a different form of Income verification. She states that she will look for 1040 and fax them to me. Provided her with [facility] fax number.  Will follow up with patients wife in 3-5 business days.  [name] [place], [place] Management [phone]

## 2018-07-31 ENCOUNTER — Ambulatory Visit: Payer: Self-pay | Admitting: Pharmacist

## 2018-08-02 ENCOUNTER — Ambulatory Visit: Payer: Self-pay | Admitting: Pharmacy Technician

## 2018-08-03 ENCOUNTER — Ambulatory Visit: Payer: Self-pay | Admitting: Pharmacy Technician

## 2018-08-04 ENCOUNTER — Ambulatory Visit: Payer: Self-pay | Admitting: Pharmacy Technician

## 2018-08-04 ENCOUNTER — Other Ambulatory Visit: Payer: Self-pay | Admitting: Pharmacy Technician

## 2018-08-04 NOTE — Patient Outreach (Signed)
[place] [facility]) Care Management  08/04/2018  Christina Chen 09-09-1946 [id number]   Unsuccessful call to patient, HIPAA complaint voicemail left.  Will make 2nd call attempt in 2-3 business days.  [name] [place], [place] Management [phone]

## 2018-08-07 ENCOUNTER — Other Ambulatory Visit: Payer: Self-pay | Admitting: Pharmacist

## 2018-08-07 NOTE — Patient Outreach (Signed)
[place] [facility]) Care Management  [place] 08/07/2018  Christina Chen [birth year] [id number]  Reason for referral: medication assistance  10:11AM Call placed to [name] regarding patient assistance application needing additional proof of income.  Patient requests that I call her back in 30 minutes.   11:00AM 3rd unsuccessful outreach call to [name].  I left a HIPAA compliant voicemail.   Plan: Follow up on Friday, October 25th.  If no response from patient at this time, [facility] will close case.  [name], PharmD, [place] [phone]

## 2018-08-08 ENCOUNTER — Other Ambulatory Visit: Payer: Self-pay | Admitting: Pharmacy Technician

## 2018-08-09 NOTE — Patient Outreach (Signed)
[place] [facility]) Care Management  08/09/2018  Christina Chen Dec 08, 1945 [id number]  08/08/2018  Incoming call from patient requesting our fax number. [name] states she is going to have her tax preparer fax her 1040 form in directly to me.  Will submit to [employer] once documents has been received.  08/09/2018  Received patient 8592 document.  Faxed documents into [employer], will follow up with company in 2-3 business days to check status of application.  [name] [place], [place] Management [phone]

## 2018-08-10 DIAGNOSIS — E1042 Type 1 diabetes mellitus with diabetic polyneuropathy: Secondary | ICD-10-CM | POA: Diagnosis not present

## 2018-08-10 DIAGNOSIS — E10649 Type 1 diabetes mellitus with hypoglycemia without coma: Secondary | ICD-10-CM | POA: Diagnosis not present

## 2018-08-11 ENCOUNTER — Ambulatory Visit: Payer: Self-pay | Admitting: Pharmacist

## 2018-08-16 ENCOUNTER — Other Ambulatory Visit: Payer: Self-pay | Admitting: Pharmacy Technician

## 2018-08-16 NOTE — Patient Outreach (Signed)
[place] [facility]) Care Management  08/16/2018  [name] [birth year] [id number]   Care coordination call placed to [place] to check on the patient's medication assistance application for Novolog.  Spoke to [place] who said patient is approved 08/15/18-09/16/18. She will received a 3 months supply and it will ship to the patient's provide office, Mee Hives with in 7-10 business days.  Will route note to [place] for followup.  Kimela Malstrom P. Brigida Scotti, [place] Management [phone]

## 2018-08-16 NOTE — Patient Outreach (Signed)
[place] [facility]) Care Management  08/16/2018  DELETHA JAFFEE 05-05-1946 [id number]   Addendum:  Incoming call received from Mrs. Goosby asking if he had received her 1040 from her accountant's office.  HIPPA identifiers obtained for her and spouse, Chalmers Cater. Informed patient that we did receive the tax documents and also informed the patient that she was approved for her Novolog [employer]) and that Mr. Nalanie Winiecki had been approved for his Lantus (Sanofi) and [employer]). Informed patient that they would be receiving a 90 days supply of the medications and to expect a phone call from their respective doctor offices w/in 7-10 business days saying the medication had arrived.  Will route [facility] Etter Sjogren a note for followup.  Irving Lubbers P. Sari Cogan, [place] Management [phone]

## 2018-08-31 ENCOUNTER — Ambulatory Visit
Admission: RE | Admit: 2018-08-31 | Discharge: 2018-08-31 | Disposition: A | Discharge status: Home / Self Care | Payer: PPO | Source: Ambulatory Visit | Attending: Family Medicine | Admitting: Family Medicine

## 2018-08-31 DIAGNOSIS — Z1231 Encounter for screening mammogram for malignant neoplasm of breast: Secondary | ICD-10-CM | POA: Insufficient documentation

## 2018-09-06 ENCOUNTER — Other Ambulatory Visit: Payer: Self-pay | Admitting: Pharmacist

## 2018-09-06 ENCOUNTER — Other Ambulatory Visit: Payer: Self-pay | Admitting: Pharmacy Technician

## 2018-09-06 NOTE — Patient Outreach (Signed)
[place] [facility]) Care Management  09/06/2018  Christina Chen 08-Jan-1946 [id number]   Successful call to patient, HIPAA identifiers verified. [name] confirms that she received her Novolog from [employer] patient assistance. Informed her that the program ends at the end of the year. Also confirmed she had my information and informed her to reach out to me next year when she has spent about $600 OOP to restart the process. She stated she would.  Will route note to [place] for case closure.  [name] Chana Bode [place] Certified Pharmacy Technician [place] Management Direct Dial:[phone]

## 2018-09-06 NOTE — Patient Outreach (Signed)
[place] [facility]) Care Management [place]  09/06/2018  Christina Chen [birth year] [id number]  Reason for referral: medication assistance  Note routed from [place] technician that patient has received Novolog.  No further medication questions at this time.   Plan: [facility] pharmacy case is being closed due to the following reasons:  Goals have been met.   I am happy to assist in the future as needed.   [name], PharmD, [place] [phone]

## 2018-09-11 DIAGNOSIS — E1042 Type 1 diabetes mellitus with diabetic polyneuropathy: Secondary | ICD-10-CM | POA: Diagnosis not present

## 2018-09-11 DIAGNOSIS — E10649 Type 1 diabetes mellitus with hypoglycemia without coma: Secondary | ICD-10-CM | POA: Diagnosis not present

## 2018-09-19 DIAGNOSIS — E785 Hyperlipidemia, unspecified: Secondary | ICD-10-CM | POA: Diagnosis not present

## 2018-09-19 DIAGNOSIS — E1042 Type 1 diabetes mellitus with diabetic polyneuropathy: Secondary | ICD-10-CM | POA: Diagnosis not present

## 2018-09-19 DIAGNOSIS — E1069 Type 1 diabetes mellitus with other specified complication: Secondary | ICD-10-CM | POA: Diagnosis not present

## 2018-09-19 DIAGNOSIS — E039 Hypothyroidism, unspecified: Secondary | ICD-10-CM | POA: Diagnosis not present

## 2018-09-20 DIAGNOSIS — I10 Essential (primary) hypertension: Secondary | ICD-10-CM | POA: Diagnosis not present

## 2018-09-20 DIAGNOSIS — E559 Vitamin D deficiency, unspecified: Secondary | ICD-10-CM | POA: Diagnosis not present

## 2018-09-20 DIAGNOSIS — E039 Hypothyroidism, unspecified: Secondary | ICD-10-CM | POA: Diagnosis not present

## 2018-09-20 DIAGNOSIS — E785 Hyperlipidemia, unspecified: Secondary | ICD-10-CM | POA: Diagnosis not present

## 2018-09-20 DIAGNOSIS — E1069 Type 1 diabetes mellitus with other specified complication: Secondary | ICD-10-CM | POA: Diagnosis not present

## 2018-09-20 DIAGNOSIS — E538 Deficiency of other specified B group vitamins: Secondary | ICD-10-CM | POA: Diagnosis not present

## 2018-09-27 DIAGNOSIS — E785 Hyperlipidemia, unspecified: Secondary | ICD-10-CM | POA: Diagnosis not present

## 2018-09-27 DIAGNOSIS — Z0000 Encounter for general adult medical examination without abnormal findings: Secondary | ICD-10-CM | POA: Diagnosis not present

## 2018-09-27 DIAGNOSIS — E538 Deficiency of other specified B group vitamins: Secondary | ICD-10-CM | POA: Diagnosis not present

## 2018-09-27 DIAGNOSIS — I10 Essential (primary) hypertension: Secondary | ICD-10-CM | POA: Diagnosis not present

## 2018-09-27 DIAGNOSIS — E1069 Type 1 diabetes mellitus with other specified complication: Secondary | ICD-10-CM | POA: Diagnosis not present

## 2018-09-27 DIAGNOSIS — E559 Vitamin D deficiency, unspecified: Secondary | ICD-10-CM | POA: Diagnosis not present

## 2018-09-27 DIAGNOSIS — E039 Hypothyroidism, unspecified: Secondary | ICD-10-CM | POA: Diagnosis not present

## 2018-09-28 DIAGNOSIS — M65332 Trigger finger, left middle finger: Secondary | ICD-10-CM | POA: Diagnosis not present

## 2018-09-29 DIAGNOSIS — M65332 Trigger finger, left middle finger: Secondary | ICD-10-CM | POA: Diagnosis not present

## 2018-10-13 DIAGNOSIS — L400 Psoriasis vulgaris: Secondary | ICD-10-CM | POA: Diagnosis not present

## 2018-10-17 DIAGNOSIS — E1042 Type 1 diabetes mellitus with diabetic polyneuropathy: Secondary | ICD-10-CM | POA: Diagnosis not present

## 2018-10-17 DIAGNOSIS — E10649 Type 1 diabetes mellitus with hypoglycemia without coma: Secondary | ICD-10-CM | POA: Diagnosis not present

## 2018-10-19 ENCOUNTER — Encounter: Payer: Self-pay | Admitting: Dietician

## 2018-10-19 ENCOUNTER — Encounter: Payer: PPO | Attending: "Endocrinology | Admitting: Dietician

## 2018-10-19 VITALS — Ht 62.5 in | Wt 159.9 lb

## 2018-10-19 DIAGNOSIS — Z713 Dietary counseling and surveillance: Secondary | ICD-10-CM | POA: Insufficient documentation

## 2018-10-19 DIAGNOSIS — E1042 Type 1 diabetes mellitus with diabetic polyneuropathy: Secondary | ICD-10-CM | POA: Insufficient documentation

## 2018-10-19 NOTE — Progress Notes (Signed)
Medical Nutrition Therapy: Visit start time: 10:30am  end time:11:40am Assessment:  Diagnosis: Type 1 diabetes with diabetic polyneuropathy Past medical history: hyperlipidemia, hypothyroidism, Vit.D and B12 deficiency Psychosocial issues/ stress concerns:  Patient rates her stress as moderate and indicates "ok" as to how well she is dealing with her stress  Preferred learning method:  Nicki [place] . Hands-on Current weight: 159.9 lbs Height: 62 in Medications, supplements: see list  Progress and evaluation:  Patient in for initial medical nutrition therapy appointment. Long term history of Type 1 Diabetes. Wears a Medtronic 723 pump as well as Dexcom sensor.  She states she wants to lower her HgA1c that was 8.3 12/'19 which is the same result ast 8/'19. She also states she would like to lose weight with weight goal of 155 lbs. Her weight has remained relatively stable in the past few years.  She states she starting exercising several weeks ago and decreased chips as a snack and lost 3 lbs.  She presently eats 3 meals/day and a bedtime snack. She reads labels for carbohydrate gms to enter into pump but doesn't always know how to estimate carbohydrates in fruits or other foods when there is not a label. Her main beverage is water and she has decreased diet sodas from 3 daily to 1. She occasionally experiences low blood sugar and treats with 4 glucose tablets or 4 oz juice or both depending on glucose level.  Physical activity: Has started using her treadmill at home or the treadmill at the gym 2-3 days per week for 20 minutes   Nutrition Care Education: Diabetes:   Instructed on a meal plan for diabetes based on 1400 calories. Reviewed carbohydrate counting and portion control using food models, food plate and "Planning a Balanced Meal." Also, gave and reviewed list of snack options for bedtime snack that includes a carbohydrate serving (15gms) and 1 oz protein. Stressed importance of  balancing meals with protein, carbohydrate and non-starchy vegetables for more stable glucose readings and to prevent low blood sugars. Discussed prevention and treatment of low blood sugars and commended for carrying glucose tablets, juice and a package of snack crackers with her at all times.   Intervention:  Eat 3 meals per day and an evening snack. Balance meals with 1-2 starch servings, 1 serving fruit and non-starchy vegetables. Spread 9 servings of carbohydrate over 3 meals and 1 snack. Include a starch serving and 1 oz protein for bedtime snack. Refer to hand-out. Increase intake of non-starchy vegetables. Be diligent in reading labels for accuracy of carbohydrate amounts. Use hand-out given for estimating carbohydrate grams when label is not available. Continue to carry glucose tablets, juice and peanut/butter and crackers at all times. Education Materials given:  . Plate Planner . Food lists/ Planning A Balanced Meal . Sample meal pattern/ menus . Snacking handout . Goals/ instructions Learner/ who was taught:  . Patient   Level of understanding: Verbalizes understanding Demonstrated degree of understanding via:   Teach back Learning barriers: . None  Willingness to learn/ readiness for change: Change in progress Monitoring and Evaluation:   Patient did not schedule a follow-up appointment. Encouraged her to call if desires further help with her diet/nutrition.

## 2018-10-19 NOTE — Patient Instructions (Addendum)
Eat 3 meals per day and an evening snack. Balance meals with 1-2 starch servings, 1 serving fruit and non-starchy vegetables. Spread 9 servings of carbohydrate over 3 meals and 1 snack. Include a starch serving and 1 oz protein for bedtime snack. Refer to hand-out. Increase intake of non-starchy vegetables. Be diligent in reading labels for accuracy of carbohydrate amounts. Use hand-out given for estimating carbohydrate grams when label is not available. Continue to carry glucose tablets, juice and peanut/butter and crackers at all times.

## 2018-10-26 DIAGNOSIS — E113391 Type 2 diabetes mellitus with moderate nonproliferative diabetic retinopathy without macular edema, right eye: Secondary | ICD-10-CM | POA: Diagnosis not present

## 2018-11-23 DIAGNOSIS — E1042 Type 1 diabetes mellitus with diabetic polyneuropathy: Secondary | ICD-10-CM | POA: Diagnosis not present

## 2018-11-23 DIAGNOSIS — E10649 Type 1 diabetes mellitus with hypoglycemia without coma: Secondary | ICD-10-CM | POA: Diagnosis not present

## 2018-11-29 DIAGNOSIS — E039 Hypothyroidism, unspecified: Secondary | ICD-10-CM | POA: Diagnosis not present

## 2018-12-20 DIAGNOSIS — L400 Psoriasis vulgaris: Secondary | ICD-10-CM | POA: Diagnosis not present

## 2018-12-25 DIAGNOSIS — E10649 Type 1 diabetes mellitus with hypoglycemia without coma: Secondary | ICD-10-CM | POA: Diagnosis not present

## 2018-12-25 DIAGNOSIS — E1042 Type 1 diabetes mellitus with diabetic polyneuropathy: Secondary | ICD-10-CM | POA: Diagnosis not present

## 2018-12-27 DIAGNOSIS — E559 Vitamin D deficiency, unspecified: Secondary | ICD-10-CM | POA: Diagnosis not present

## 2018-12-27 DIAGNOSIS — E1042 Type 1 diabetes mellitus with diabetic polyneuropathy: Secondary | ICD-10-CM | POA: Diagnosis not present

## 2018-12-27 DIAGNOSIS — E538 Deficiency of other specified B group vitamins: Secondary | ICD-10-CM | POA: Diagnosis not present

## 2018-12-27 DIAGNOSIS — E039 Hypothyroidism, unspecified: Secondary | ICD-10-CM | POA: Diagnosis not present

## 2019-01-15 DIAGNOSIS — E039 Hypothyroidism, unspecified: Secondary | ICD-10-CM | POA: Diagnosis not present

## 2019-01-15 DIAGNOSIS — I10 Essential (primary) hypertension: Secondary | ICD-10-CM | POA: Diagnosis not present

## 2019-01-15 DIAGNOSIS — L03011 Cellulitis of right finger: Secondary | ICD-10-CM | POA: Diagnosis not present

## 2019-01-25 DIAGNOSIS — R928 Other abnormal and inconclusive findings on diagnostic imaging of breast: Secondary | ICD-10-CM | POA: Diagnosis not present

## 2019-01-25 DIAGNOSIS — E1042 Type 1 diabetes mellitus with diabetic polyneuropathy: Secondary | ICD-10-CM | POA: Diagnosis not present

## 2019-01-25 DIAGNOSIS — E10649 Type 1 diabetes mellitus with hypoglycemia without coma: Secondary | ICD-10-CM | POA: Diagnosis not present

## 2019-01-29 DIAGNOSIS — L03011 Cellulitis of right finger: Secondary | ICD-10-CM | POA: Diagnosis not present

## 2019-01-30 DIAGNOSIS — L03011 Cellulitis of right finger: Secondary | ICD-10-CM | POA: Diagnosis not present

## 2019-02-01 DIAGNOSIS — L600 Ingrowing nail: Secondary | ICD-10-CM | POA: Diagnosis not present

## 2019-02-26 DIAGNOSIS — E1042 Type 1 diabetes mellitus with diabetic polyneuropathy: Secondary | ICD-10-CM | POA: Diagnosis not present

## 2019-02-26 DIAGNOSIS — E10649 Type 1 diabetes mellitus with hypoglycemia without coma: Secondary | ICD-10-CM | POA: Diagnosis not present

## 2019-02-28 ENCOUNTER — Other Ambulatory Visit: Payer: Self-pay | Admitting: Pharmacy Technician

## 2019-02-28 NOTE — Patient Outreach (Signed)
[facility]) Care Management  02/28/2019  DONINIQUE LWIN [birth year] [id number]   Incoming call from patient on 02/27/19 asking for help with patient assistance for her Novolog and her husbands Lantus Engineer, civil (consulting) and [employer].   02/28/19  Return call to patient, HIPAA identifiers verified. Informed Mrs. Holohan that we would be able to start the process of patient assistance for her Novolog thru [employer]. Mrs. Mahr states that she has a pump. Informed her that application will require proof of household income.  Will route note to [place] for medication review. Upon review will [employer] application to be mailed out to patient and will fax provider portion to Dr. Mee Hives.  [name] Chana Bode [place] Certified Pharmacy Technician [place] Management Direct Dial:[phone]

## 2019-03-01 ENCOUNTER — Other Ambulatory Visit: Payer: Self-pay | Admitting: Pharmacist

## 2019-03-01 NOTE — Patient Outreach (Signed)
[place] [facility]) Care Management  [name] 03/01/2019  Christina Chen [birth year] [id number]  Patient request for [facility] medication assistance with Novolog U-100 insulin vials.  [facility] assisted patient with medication assistance in 2019.    Per review of [facility], patient remains on Novolog insulin via Medtronic pump for T1DM.  Managed by Dr. Mee Hives.  OK for [facility] pharmacy technician to continue medication assistance.   [name], PharmD, [place] [phone]

## 2019-03-02 ENCOUNTER — Other Ambulatory Visit: Payer: Self-pay | Admitting: Pharmacy Technician

## 2019-03-02 ENCOUNTER — Encounter: Payer: Self-pay | Admitting: Pharmacy Technician

## 2019-03-02 NOTE — Patient Outreach (Signed)
[name]) Care Management  03/02/2019  Christina Chen [birth year] [id number]   Prepared Novo Nordisk patient assistance application for Novolog to mailed out to patient and faxed provider portion to Dr. Honor Junes.  Will follow up with patient in 7-10 business days to confirm application has been received.  [name] Certified Pharmacy Technician [place] Management Direct Dial:[phone]

## 2019-03-07 ENCOUNTER — Other Ambulatory Visit: Payer: Self-pay | Admitting: Pharmacy Technician

## 2019-03-07 NOTE — Patient Outreach (Addendum)
[place] [facility]) Care Management  03/07/2019  Christina Chen 1946-02-14 [id number]   Voicemail left by patient stating she received patient assistance application and has questions.  Unsuccessful call placed to patient, HIPAA compliant voicemail left.  [name] Chana Bode [place] Certified Pharmacy Technician [place] Management Direct Dial:[phone]    ADDENDUM 11:36am  Return call from patient verifying what documents are required for proof of income. Informed and she stated she would mail application and documents back in.  [name] Chana Bode [place] Certified Pharmacy Technician [place] Management Direct Dial:[phone]

## 2019-03-15 ENCOUNTER — Other Ambulatory Visit: Payer: Self-pay | Admitting: Pharmacy Technician

## 2019-03-16 NOTE — Patient Outreach (Signed)
[place] [facility]) Care Management  03/16/2019  WESTON FULCO [birth year] [id number]   Received patient portion(s) of patient assistance application for Novolog. Faxed completed application and required documents into [employer].  Will follow up with company in 3-5 business days to check status of application.  [name] Chana Bode [place] Certified Pharmacy Technician [place] Management Direct Dial:[phone]

## 2019-03-20 ENCOUNTER — Other Ambulatory Visit: Payer: Self-pay | Admitting: Pharmacy Technician

## 2019-03-20 NOTE — Patient Outreach (Signed)
[place] [facility]) Care Management  03/20/2019  NELLINE LIO May 03, 1946 [id number]    Follow up call placed to [employer] regarding patient assistance application(s) for Novolog , Mardene Celeste confirms patient has been approved as of 5/30 until 09/17/19. Medication to arrive at providers office in 10-14 business days.  Follow up:  Will follow up with patient in 10-14 business days to confirm medication has been received.  [name] Chana Bode [place] Certified Pharmacy Technician [place] Management Direct [phone]

## 2019-03-22 DIAGNOSIS — E1069 Type 1 diabetes mellitus with other specified complication: Secondary | ICD-10-CM | POA: Diagnosis not present

## 2019-03-22 DIAGNOSIS — E785 Hyperlipidemia, unspecified: Secondary | ICD-10-CM | POA: Diagnosis not present

## 2019-03-22 DIAGNOSIS — E538 Deficiency of other specified B group vitamins: Secondary | ICD-10-CM | POA: Diagnosis not present

## 2019-03-22 DIAGNOSIS — I10 Essential (primary) hypertension: Secondary | ICD-10-CM | POA: Diagnosis not present

## 2019-03-22 DIAGNOSIS — E559 Vitamin D deficiency, unspecified: Secondary | ICD-10-CM | POA: Diagnosis not present

## 2019-03-29 DIAGNOSIS — E785 Hyperlipidemia, unspecified: Secondary | ICD-10-CM | POA: Diagnosis not present

## 2019-03-29 DIAGNOSIS — E10649 Type 1 diabetes mellitus with hypoglycemia without coma: Secondary | ICD-10-CM | POA: Diagnosis not present

## 2019-03-29 DIAGNOSIS — I10 Essential (primary) hypertension: Secondary | ICD-10-CM | POA: Diagnosis not present

## 2019-03-29 DIAGNOSIS — E538 Deficiency of other specified B group vitamins: Secondary | ICD-10-CM | POA: Diagnosis not present

## 2019-03-29 DIAGNOSIS — E1069 Type 1 diabetes mellitus with other specified complication: Secondary | ICD-10-CM | POA: Diagnosis not present

## 2019-03-29 DIAGNOSIS — E1042 Type 1 diabetes mellitus with diabetic polyneuropathy: Secondary | ICD-10-CM | POA: Diagnosis not present

## 2019-03-29 DIAGNOSIS — Z0000 Encounter for general adult medical examination without abnormal findings: Secondary | ICD-10-CM | POA: Diagnosis not present

## 2019-03-29 DIAGNOSIS — E039 Hypothyroidism, unspecified: Secondary | ICD-10-CM | POA: Diagnosis not present

## 2019-04-12 DIAGNOSIS — L03032 Cellulitis of left toe: Secondary | ICD-10-CM | POA: Diagnosis not present

## 2019-04-17 ENCOUNTER — Other Ambulatory Visit: Payer: Self-pay | Admitting: Pharmacy Technician

## 2019-04-17 NOTE — Patient Outreach (Addendum)
[place] [facility]) Care Management  04/17/2019  Christina Chen 25-Mar-1946 [id number]    Unsuccessful call #1 placed to patient regarding patient assistance medication receipt from [employer], HIPAA compliant voicemail left.   Follow up:  Will make 2nd call attempt in 5-7 business days of call has not been returned.  [name] Chana Bode [place] Certified Pharmacy Technician [place] Management Direct Dial:[phone]   ADDENDUM 4:15pm  Return call from Christina Chen confirming her and [name] received their Novologs from [employer] patient assistance.  Will follow up with [name]' Sanofi patient assistance in 2-3 business days.  Will route note to [place] for case closure.  [name] Chana Bode [place] Certified Pharmacy Technician [place] Management Direct Dial:[phone]

## 2019-04-18 ENCOUNTER — Other Ambulatory Visit: Payer: Self-pay | Admitting: Pharmacist

## 2019-04-18 NOTE — Patient Outreach (Signed)
[place] [facility]) Care Management [place]  04/18/2019  JESSIAH WOJNAR 05-Mar-1946 [id number]  Patient successfully approved for [employer] patient assistance program for Novolog through 10/18/2019.    [facility] pharmacy case is being closed due to the following reasons:  Goals have been met. -Patient has been provided [facility] CM contact information if assistance needed in the future.   -Thank you for allowing [facility] pharmacy to be involved in this patient's care.    [name], PharmD, [place] [phone]

## 2019-04-26 DIAGNOSIS — E1042 Type 1 diabetes mellitus with diabetic polyneuropathy: Secondary | ICD-10-CM | POA: Diagnosis not present

## 2019-04-26 DIAGNOSIS — E10649 Type 1 diabetes mellitus with hypoglycemia without coma: Secondary | ICD-10-CM | POA: Diagnosis not present

## 2019-04-26 DIAGNOSIS — L03032 Cellulitis of left toe: Secondary | ICD-10-CM | POA: Diagnosis not present

## 2019-04-30 DIAGNOSIS — E1042 Type 1 diabetes mellitus with diabetic polyneuropathy: Secondary | ICD-10-CM | POA: Diagnosis not present

## 2019-04-30 DIAGNOSIS — E10649 Type 1 diabetes mellitus with hypoglycemia without coma: Secondary | ICD-10-CM | POA: Diagnosis not present

## 2019-05-02 DIAGNOSIS — E1069 Type 1 diabetes mellitus with other specified complication: Secondary | ICD-10-CM | POA: Diagnosis not present

## 2019-05-02 DIAGNOSIS — E559 Vitamin D deficiency, unspecified: Secondary | ICD-10-CM | POA: Diagnosis not present

## 2019-05-02 DIAGNOSIS — E785 Hyperlipidemia, unspecified: Secondary | ICD-10-CM | POA: Diagnosis not present

## 2019-05-02 DIAGNOSIS — E039 Hypothyroidism, unspecified: Secondary | ICD-10-CM | POA: Diagnosis not present

## 2019-05-02 DIAGNOSIS — E1042 Type 1 diabetes mellitus with diabetic polyneuropathy: Secondary | ICD-10-CM | POA: Diagnosis not present

## 2019-05-30 DIAGNOSIS — M79642 Pain in left hand: Secondary | ICD-10-CM | POA: Diagnosis not present

## 2019-06-04 DIAGNOSIS — Z79899 Other long term (current) drug therapy: Secondary | ICD-10-CM | POA: Diagnosis not present

## 2019-06-08 DIAGNOSIS — M65332 Trigger finger, left middle finger: Secondary | ICD-10-CM | POA: Diagnosis not present

## 2019-06-08 DIAGNOSIS — M65322 Trigger finger, left index finger: Secondary | ICD-10-CM | POA: Diagnosis not present

## 2019-06-18 DIAGNOSIS — D225 Melanocytic nevi of trunk: Secondary | ICD-10-CM | POA: Diagnosis not present

## 2019-06-18 DIAGNOSIS — Z79899 Other long term (current) drug therapy: Secondary | ICD-10-CM | POA: Diagnosis not present

## 2019-06-18 DIAGNOSIS — D2272 Melanocytic nevi of left lower limb, including hip: Secondary | ICD-10-CM | POA: Diagnosis not present

## 2019-06-18 DIAGNOSIS — D2261 Melanocytic nevi of right upper limb, including shoulder: Secondary | ICD-10-CM | POA: Diagnosis not present

## 2019-06-18 DIAGNOSIS — L400 Psoriasis vulgaris: Secondary | ICD-10-CM | POA: Diagnosis not present

## 2019-06-18 DIAGNOSIS — D2271 Melanocytic nevi of right lower limb, including hip: Secondary | ICD-10-CM | POA: Diagnosis not present

## 2019-06-18 DIAGNOSIS — L821 Other seborrheic keratosis: Secondary | ICD-10-CM | POA: Diagnosis not present

## 2019-06-18 DIAGNOSIS — D2262 Melanocytic nevi of left upper limb, including shoulder: Secondary | ICD-10-CM | POA: Diagnosis not present

## 2019-06-27 DIAGNOSIS — E1042 Type 1 diabetes mellitus with diabetic polyneuropathy: Secondary | ICD-10-CM | POA: Diagnosis not present

## 2019-06-27 DIAGNOSIS — E10649 Type 1 diabetes mellitus with hypoglycemia without coma: Secondary | ICD-10-CM | POA: Diagnosis not present

## 2019-07-02 DIAGNOSIS — L600 Ingrowing nail: Secondary | ICD-10-CM | POA: Diagnosis not present

## 2019-07-02 DIAGNOSIS — M65332 Trigger finger, left middle finger: Secondary | ICD-10-CM | POA: Diagnosis not present

## 2019-07-02 DIAGNOSIS — M65322 Trigger finger, left index finger: Secondary | ICD-10-CM | POA: Diagnosis not present

## 2019-07-27 DIAGNOSIS — E1042 Type 1 diabetes mellitus with diabetic polyneuropathy: Secondary | ICD-10-CM | POA: Diagnosis not present

## 2019-07-27 DIAGNOSIS — E10649 Type 1 diabetes mellitus with hypoglycemia without coma: Secondary | ICD-10-CM | POA: Diagnosis not present

## 2019-08-15 DIAGNOSIS — E559 Vitamin D deficiency, unspecified: Secondary | ICD-10-CM | POA: Diagnosis not present

## 2019-08-15 DIAGNOSIS — E1069 Type 1 diabetes mellitus with other specified complication: Secondary | ICD-10-CM | POA: Diagnosis not present

## 2019-08-15 DIAGNOSIS — E119 Type 2 diabetes mellitus without complications: Secondary | ICD-10-CM | POA: Diagnosis not present

## 2019-08-15 DIAGNOSIS — E039 Hypothyroidism, unspecified: Secondary | ICD-10-CM | POA: Diagnosis not present

## 2019-08-15 DIAGNOSIS — E1042 Type 1 diabetes mellitus with diabetic polyneuropathy: Secondary | ICD-10-CM | POA: Diagnosis not present

## 2019-08-15 DIAGNOSIS — E785 Hyperlipidemia, unspecified: Secondary | ICD-10-CM | POA: Diagnosis not present

## 2019-08-27 DIAGNOSIS — E1042 Type 1 diabetes mellitus with diabetic polyneuropathy: Secondary | ICD-10-CM | POA: Diagnosis not present

## 2019-08-27 DIAGNOSIS — E10649 Type 1 diabetes mellitus with hypoglycemia without coma: Secondary | ICD-10-CM | POA: Diagnosis not present

## 2019-09-17 ENCOUNTER — Other Ambulatory Visit: Payer: Self-pay | Admitting: Family Medicine

## 2019-09-17 DIAGNOSIS — Z1231 Encounter for screening mammogram for malignant neoplasm of breast: Secondary | ICD-10-CM

## 2019-09-20 ENCOUNTER — Ambulatory Visit
Admission: RE | Admit: 2019-09-20 | Discharge: 2019-09-20 | Disposition: A | Discharge status: Home / Self Care | Payer: PPO | Source: Ambulatory Visit | Attending: Family Medicine | Admitting: Family Medicine

## 2019-09-20 DIAGNOSIS — Z1231 Encounter for screening mammogram for malignant neoplasm of breast: Secondary | ICD-10-CM | POA: Insufficient documentation

## 2019-09-26 DIAGNOSIS — E538 Deficiency of other specified B group vitamins: Secondary | ICD-10-CM | POA: Diagnosis not present

## 2019-09-26 DIAGNOSIS — E039 Hypothyroidism, unspecified: Secondary | ICD-10-CM | POA: Diagnosis not present

## 2019-09-26 DIAGNOSIS — E1069 Type 1 diabetes mellitus with other specified complication: Secondary | ICD-10-CM | POA: Diagnosis not present

## 2019-09-26 DIAGNOSIS — I10 Essential (primary) hypertension: Secondary | ICD-10-CM | POA: Diagnosis not present

## 2019-09-26 DIAGNOSIS — E785 Hyperlipidemia, unspecified: Secondary | ICD-10-CM | POA: Diagnosis not present

## 2019-09-26 DIAGNOSIS — E1042 Type 1 diabetes mellitus with diabetic polyneuropathy: Secondary | ICD-10-CM | POA: Diagnosis not present

## 2019-09-26 DIAGNOSIS — E10649 Type 1 diabetes mellitus with hypoglycemia without coma: Secondary | ICD-10-CM | POA: Diagnosis not present

## 2019-10-03 DIAGNOSIS — I10 Essential (primary) hypertension: Secondary | ICD-10-CM | POA: Diagnosis not present

## 2019-10-03 DIAGNOSIS — E039 Hypothyroidism, unspecified: Secondary | ICD-10-CM | POA: Diagnosis not present

## 2019-10-03 DIAGNOSIS — E785 Hyperlipidemia, unspecified: Secondary | ICD-10-CM | POA: Diagnosis not present

## 2019-10-03 DIAGNOSIS — E1069 Type 1 diabetes mellitus with other specified complication: Secondary | ICD-10-CM | POA: Diagnosis not present

## 2019-10-03 DIAGNOSIS — Z0000 Encounter for general adult medical examination without abnormal findings: Secondary | ICD-10-CM | POA: Diagnosis not present

## 2019-10-26 DIAGNOSIS — E1042 Type 1 diabetes mellitus with diabetic polyneuropathy: Secondary | ICD-10-CM | POA: Diagnosis not present

## 2019-10-26 DIAGNOSIS — E10649 Type 1 diabetes mellitus with hypoglycemia without coma: Secondary | ICD-10-CM | POA: Diagnosis not present

## 2019-11-05 DIAGNOSIS — E10649 Type 1 diabetes mellitus with hypoglycemia without coma: Secondary | ICD-10-CM | POA: Diagnosis not present

## 2019-11-05 DIAGNOSIS — E1042 Type 1 diabetes mellitus with diabetic polyneuropathy: Secondary | ICD-10-CM | POA: Diagnosis not present

## 2019-11-12 DIAGNOSIS — E113391 Type 2 diabetes mellitus with moderate nonproliferative diabetic retinopathy without macular edema, right eye: Secondary | ICD-10-CM | POA: Diagnosis not present

## 2019-12-04 DIAGNOSIS — E039 Hypothyroidism, unspecified: Secondary | ICD-10-CM | POA: Diagnosis not present

## 2019-12-04 DIAGNOSIS — E1042 Type 1 diabetes mellitus with diabetic polyneuropathy: Secondary | ICD-10-CM | POA: Diagnosis not present

## 2019-12-06 DIAGNOSIS — H26491 Other secondary cataract, right eye: Secondary | ICD-10-CM | POA: Diagnosis not present

## 2019-12-06 DIAGNOSIS — E1042 Type 1 diabetes mellitus with diabetic polyneuropathy: Secondary | ICD-10-CM | POA: Diagnosis not present

## 2019-12-06 DIAGNOSIS — E10649 Type 1 diabetes mellitus with hypoglycemia without coma: Secondary | ICD-10-CM | POA: Diagnosis not present

## 2019-12-20 DIAGNOSIS — F418 Other specified anxiety disorders: Secondary | ICD-10-CM | POA: Diagnosis not present

## 2020-01-03 DIAGNOSIS — L400 Psoriasis vulgaris: Secondary | ICD-10-CM | POA: Diagnosis not present

## 2020-01-07 DIAGNOSIS — E10649 Type 1 diabetes mellitus with hypoglycemia without coma: Secondary | ICD-10-CM | POA: Diagnosis not present

## 2020-01-07 DIAGNOSIS — E1042 Type 1 diabetes mellitus with diabetic polyneuropathy: Secondary | ICD-10-CM | POA: Diagnosis not present

## 2020-01-31 DIAGNOSIS — F418 Other specified anxiety disorders: Secondary | ICD-10-CM | POA: Diagnosis not present

## 2020-02-07 DIAGNOSIS — E10649 Type 1 diabetes mellitus with hypoglycemia without coma: Secondary | ICD-10-CM | POA: Diagnosis not present

## 2020-02-07 DIAGNOSIS — E1042 Type 1 diabetes mellitus with diabetic polyneuropathy: Secondary | ICD-10-CM | POA: Diagnosis not present

## 2020-02-26 DIAGNOSIS — F418 Other specified anxiety disorders: Secondary | ICD-10-CM | POA: Diagnosis not present

## 2020-02-27 DIAGNOSIS — S90212A Contusion of left great toe with damage to nail, initial encounter: Secondary | ICD-10-CM | POA: Diagnosis not present

## 2020-02-27 DIAGNOSIS — S92425A Nondisplaced fracture of distal phalanx of left great toe, initial encounter for closed fracture: Secondary | ICD-10-CM | POA: Diagnosis not present

## 2020-02-29 DIAGNOSIS — S90212A Contusion of left great toe with damage to nail, initial encounter: Secondary | ICD-10-CM | POA: Diagnosis not present

## 2020-03-10 DIAGNOSIS — E1042 Type 1 diabetes mellitus with diabetic polyneuropathy: Secondary | ICD-10-CM | POA: Diagnosis not present

## 2020-03-10 DIAGNOSIS — E10649 Type 1 diabetes mellitus with hypoglycemia without coma: Secondary | ICD-10-CM | POA: Diagnosis not present

## 2020-03-11 DIAGNOSIS — S92425A Nondisplaced fracture of distal phalanx of left great toe, initial encounter for closed fracture: Secondary | ICD-10-CM | POA: Diagnosis not present

## 2020-03-13 DIAGNOSIS — S90212A Contusion of left great toe with damage to nail, initial encounter: Secondary | ICD-10-CM | POA: Diagnosis not present

## 2020-03-13 DIAGNOSIS — E039 Hypothyroidism, unspecified: Secondary | ICD-10-CM | POA: Diagnosis not present

## 2020-03-13 DIAGNOSIS — E538 Deficiency of other specified B group vitamins: Secondary | ICD-10-CM | POA: Diagnosis not present

## 2020-03-13 DIAGNOSIS — E559 Vitamin D deficiency, unspecified: Secondary | ICD-10-CM | POA: Diagnosis not present

## 2020-03-13 DIAGNOSIS — E1042 Type 1 diabetes mellitus with diabetic polyneuropathy: Secondary | ICD-10-CM | POA: Diagnosis not present

## 2020-03-26 DIAGNOSIS — H26492 Other secondary cataract, left eye: Secondary | ICD-10-CM | POA: Diagnosis not present

## 2020-03-26 DIAGNOSIS — H43813 Vitreous degeneration, bilateral: Secondary | ICD-10-CM | POA: Diagnosis not present

## 2020-03-28 DIAGNOSIS — E559 Vitamin D deficiency, unspecified: Secondary | ICD-10-CM | POA: Diagnosis not present

## 2020-03-28 DIAGNOSIS — E538 Deficiency of other specified B group vitamins: Secondary | ICD-10-CM | POA: Diagnosis not present

## 2020-03-28 DIAGNOSIS — E785 Hyperlipidemia, unspecified: Secondary | ICD-10-CM | POA: Diagnosis not present

## 2020-03-28 DIAGNOSIS — E1069 Type 1 diabetes mellitus with other specified complication: Secondary | ICD-10-CM | POA: Diagnosis not present

## 2020-03-28 DIAGNOSIS — E039 Hypothyroidism, unspecified: Secondary | ICD-10-CM | POA: Diagnosis not present

## 2020-04-02 DIAGNOSIS — E039 Hypothyroidism, unspecified: Secondary | ICD-10-CM | POA: Diagnosis not present

## 2020-04-02 DIAGNOSIS — Z0000 Encounter for general adult medical examination without abnormal findings: Secondary | ICD-10-CM | POA: Diagnosis not present

## 2020-04-02 DIAGNOSIS — E785 Hyperlipidemia, unspecified: Secondary | ICD-10-CM | POA: Diagnosis not present

## 2020-04-02 DIAGNOSIS — I10 Essential (primary) hypertension: Secondary | ICD-10-CM | POA: Diagnosis not present

## 2020-04-02 DIAGNOSIS — E538 Deficiency of other specified B group vitamins: Secondary | ICD-10-CM | POA: Diagnosis not present

## 2020-04-02 DIAGNOSIS — E1069 Type 1 diabetes mellitus with other specified complication: Secondary | ICD-10-CM | POA: Diagnosis not present

## 2020-04-10 DIAGNOSIS — E10649 Type 1 diabetes mellitus with hypoglycemia without coma: Secondary | ICD-10-CM | POA: Diagnosis not present

## 2020-04-10 DIAGNOSIS — E1042 Type 1 diabetes mellitus with diabetic polyneuropathy: Secondary | ICD-10-CM | POA: Diagnosis not present

## 2020-04-25 DIAGNOSIS — D2272 Melanocytic nevi of left lower limb, including hip: Secondary | ICD-10-CM | POA: Diagnosis not present

## 2020-04-25 DIAGNOSIS — D2261 Melanocytic nevi of right upper limb, including shoulder: Secondary | ICD-10-CM | POA: Diagnosis not present

## 2020-04-25 DIAGNOSIS — L400 Psoriasis vulgaris: Secondary | ICD-10-CM | POA: Diagnosis not present

## 2020-04-25 DIAGNOSIS — D225 Melanocytic nevi of trunk: Secondary | ICD-10-CM | POA: Diagnosis not present

## 2020-04-25 DIAGNOSIS — D2262 Melanocytic nevi of left upper limb, including shoulder: Secondary | ICD-10-CM | POA: Diagnosis not present

## 2020-04-25 DIAGNOSIS — L821 Other seborrheic keratosis: Secondary | ICD-10-CM | POA: Diagnosis not present

## 2020-05-12 DIAGNOSIS — E1042 Type 1 diabetes mellitus with diabetic polyneuropathy: Secondary | ICD-10-CM | POA: Diagnosis not present

## 2020-05-12 DIAGNOSIS — E10649 Type 1 diabetes mellitus with hypoglycemia without coma: Secondary | ICD-10-CM | POA: Diagnosis not present

## 2020-06-09 DIAGNOSIS — D692 Other nonthrombocytopenic purpura: Secondary | ICD-10-CM | POA: Diagnosis not present

## 2020-06-09 DIAGNOSIS — S80862A Insect bite (nonvenomous), left lower leg, initial encounter: Secondary | ICD-10-CM | POA: Diagnosis not present

## 2020-06-09 DIAGNOSIS — L400 Psoriasis vulgaris: Secondary | ICD-10-CM | POA: Diagnosis not present

## 2020-06-09 DIAGNOSIS — S80861A Insect bite (nonvenomous), right lower leg, initial encounter: Secondary | ICD-10-CM | POA: Diagnosis not present

## 2020-06-12 DIAGNOSIS — E10649 Type 1 diabetes mellitus with hypoglycemia without coma: Secondary | ICD-10-CM | POA: Diagnosis not present

## 2020-06-12 DIAGNOSIS — E1042 Type 1 diabetes mellitus with diabetic polyneuropathy: Secondary | ICD-10-CM | POA: Diagnosis not present

## 2020-06-25 DIAGNOSIS — E039 Hypothyroidism, unspecified: Secondary | ICD-10-CM | POA: Diagnosis not present

## 2020-06-25 DIAGNOSIS — E785 Hyperlipidemia, unspecified: Secondary | ICD-10-CM | POA: Diagnosis not present

## 2020-06-25 DIAGNOSIS — E1069 Type 1 diabetes mellitus with other specified complication: Secondary | ICD-10-CM | POA: Diagnosis not present

## 2020-06-25 DIAGNOSIS — E1042 Type 1 diabetes mellitus with diabetic polyneuropathy: Secondary | ICD-10-CM | POA: Diagnosis not present

## 2020-08-15 DIAGNOSIS — E1042 Type 1 diabetes mellitus with diabetic polyneuropathy: Secondary | ICD-10-CM | POA: Diagnosis not present

## 2020-08-15 DIAGNOSIS — E10649 Type 1 diabetes mellitus with hypoglycemia without coma: Secondary | ICD-10-CM | POA: Diagnosis not present

## 2020-08-23 DIAGNOSIS — E1165 Type 2 diabetes mellitus with hyperglycemia: Secondary | ICD-10-CM | POA: Diagnosis not present

## 2020-08-23 DIAGNOSIS — E785 Hyperlipidemia, unspecified: Secondary | ICD-10-CM | POA: Diagnosis not present

## 2020-08-23 DIAGNOSIS — E039 Hypothyroidism, unspecified: Secondary | ICD-10-CM | POA: Diagnosis not present

## 2020-08-23 DIAGNOSIS — E869 Volume depletion, unspecified: Secondary | ICD-10-CM | POA: Diagnosis not present

## 2020-08-23 DIAGNOSIS — I517 Cardiomegaly: Secondary | ICD-10-CM | POA: Diagnosis not present

## 2020-08-23 DIAGNOSIS — Z79899 Other long term (current) drug therapy: Secondary | ICD-10-CM | POA: Diagnosis not present

## 2020-08-23 DIAGNOSIS — I272 Pulmonary hypertension, unspecified: Secondary | ICD-10-CM | POA: Diagnosis not present

## 2020-08-23 DIAGNOSIS — E1010 Type 1 diabetes mellitus with ketoacidosis without coma: Secondary | ICD-10-CM | POA: Diagnosis not present

## 2020-08-23 DIAGNOSIS — I081 Rheumatic disorders of both mitral and tricuspid valves: Secondary | ICD-10-CM | POA: Diagnosis not present

## 2020-08-23 DIAGNOSIS — L409 Psoriasis, unspecified: Secondary | ICD-10-CM | POA: Diagnosis not present

## 2020-08-23 DIAGNOSIS — I10 Essential (primary) hypertension: Secondary | ICD-10-CM | POA: Diagnosis not present

## 2020-08-23 DIAGNOSIS — R0902 Hypoxemia: Secondary | ICD-10-CM | POA: Diagnosis not present

## 2020-08-23 DIAGNOSIS — Z9641 Presence of insulin pump (external) (internal): Secondary | ICD-10-CM | POA: Diagnosis not present

## 2020-08-23 DIAGNOSIS — R1313 Dysphagia, pharyngeal phase: Secondary | ICD-10-CM | POA: Diagnosis not present

## 2020-08-23 DIAGNOSIS — Z66 Do not resuscitate: Secondary | ICD-10-CM | POA: Diagnosis not present

## 2020-08-23 DIAGNOSIS — A419 Sepsis, unspecified organism: Secondary | ICD-10-CM | POA: Diagnosis not present

## 2020-08-23 DIAGNOSIS — R059 Cough, unspecified: Secondary | ICD-10-CM | POA: Diagnosis not present

## 2020-08-23 DIAGNOSIS — R410 Disorientation, unspecified: Secondary | ICD-10-CM | POA: Diagnosis not present

## 2020-08-23 DIAGNOSIS — R5381 Other malaise: Secondary | ICD-10-CM | POA: Diagnosis not present

## 2020-08-23 DIAGNOSIS — I6782 Cerebral ischemia: Secondary | ICD-10-CM | POA: Diagnosis not present

## 2020-08-23 DIAGNOSIS — G4751 Confusional arousals: Secondary | ICD-10-CM | POA: Diagnosis not present

## 2020-08-23 DIAGNOSIS — Z20822 Contact with and (suspected) exposure to covid-19: Secondary | ICD-10-CM | POA: Diagnosis not present

## 2020-08-23 DIAGNOSIS — R739 Hyperglycemia, unspecified: Secondary | ICD-10-CM | POA: Diagnosis not present

## 2020-08-23 DIAGNOSIS — M25551 Pain in right hip: Secondary | ICD-10-CM | POA: Diagnosis not present

## 2020-08-23 DIAGNOSIS — J9811 Atelectasis: Secondary | ICD-10-CM | POA: Diagnosis not present

## 2020-08-23 DIAGNOSIS — E1069 Type 1 diabetes mellitus with other specified complication: Secondary | ICD-10-CM | POA: Diagnosis not present

## 2020-08-23 DIAGNOSIS — M1611 Unilateral primary osteoarthritis, right hip: Secondary | ICD-10-CM | POA: Diagnosis not present

## 2020-08-23 DIAGNOSIS — R4182 Altered mental status, unspecified: Secondary | ICD-10-CM | POA: Diagnosis not present

## 2020-08-23 DIAGNOSIS — E10319 Type 1 diabetes mellitus with unspecified diabetic retinopathy without macular edema: Secondary | ICD-10-CM | POA: Diagnosis not present

## 2020-08-23 DIAGNOSIS — E871 Hypo-osmolality and hyponatremia: Secondary | ICD-10-CM | POA: Diagnosis not present

## 2020-08-23 DIAGNOSIS — H9190 Unspecified hearing loss, unspecified ear: Secondary | ICD-10-CM | POA: Diagnosis not present

## 2020-08-23 DIAGNOSIS — E860 Dehydration: Secondary | ICD-10-CM | POA: Diagnosis not present

## 2020-08-23 DIAGNOSIS — R29810 Facial weakness: Secondary | ICD-10-CM | POA: Diagnosis not present

## 2020-08-23 DIAGNOSIS — F419 Anxiety disorder, unspecified: Secondary | ICD-10-CM | POA: Diagnosis not present

## 2020-08-23 DIAGNOSIS — E1042 Type 1 diabetes mellitus with diabetic polyneuropathy: Secondary | ICD-10-CM | POA: Diagnosis not present

## 2020-08-23 DIAGNOSIS — G319 Degenerative disease of nervous system, unspecified: Secondary | ICD-10-CM | POA: Diagnosis not present

## 2020-08-23 DIAGNOSIS — R1310 Dysphagia, unspecified: Secondary | ICD-10-CM | POA: Diagnosis not present

## 2020-08-23 DIAGNOSIS — T85694A Other mechanical complication of insulin pump, initial encounter: Secondary | ICD-10-CM | POA: Diagnosis not present

## 2020-08-23 DIAGNOSIS — N179 Acute kidney failure, unspecified: Secondary | ICD-10-CM | POA: Diagnosis not present

## 2020-08-23 DIAGNOSIS — E1110 Type 2 diabetes mellitus with ketoacidosis without coma: Secondary | ICD-10-CM | POA: Diagnosis not present

## 2020-08-23 DIAGNOSIS — E872 Acidosis: Secondary | ICD-10-CM | POA: Diagnosis not present

## 2020-08-23 DIAGNOSIS — E1065 Type 1 diabetes mellitus with hyperglycemia: Secondary | ICD-10-CM | POA: Diagnosis not present

## 2020-08-23 DIAGNOSIS — E875 Hyperkalemia: Secondary | ICD-10-CM | POA: Diagnosis not present

## 2020-08-24 DIAGNOSIS — R739 Hyperglycemia, unspecified: Secondary | ICD-10-CM | POA: Diagnosis not present

## 2020-08-25 DIAGNOSIS — I081 Rheumatic disorders of both mitral and tricuspid valves: Secondary | ICD-10-CM | POA: Diagnosis not present

## 2020-08-25 DIAGNOSIS — I272 Pulmonary hypertension, unspecified: Secondary | ICD-10-CM | POA: Diagnosis not present

## 2020-08-29 DIAGNOSIS — E1042 Type 1 diabetes mellitus with diabetic polyneuropathy: Secondary | ICD-10-CM | POA: Diagnosis not present

## 2020-08-29 DIAGNOSIS — E785 Hyperlipidemia, unspecified: Secondary | ICD-10-CM | POA: Diagnosis not present

## 2020-08-29 DIAGNOSIS — E1069 Type 1 diabetes mellitus with other specified complication: Secondary | ICD-10-CM | POA: Diagnosis not present

## 2020-08-29 DIAGNOSIS — E039 Hypothyroidism, unspecified: Secondary | ICD-10-CM | POA: Diagnosis not present

## 2020-08-30 DIAGNOSIS — E10319 Type 1 diabetes mellitus with unspecified diabetic retinopathy without macular edema: Secondary | ICD-10-CM | POA: Diagnosis not present

## 2020-08-30 DIAGNOSIS — E1065 Type 1 diabetes mellitus with hyperglycemia: Secondary | ICD-10-CM | POA: Diagnosis not present

## 2020-08-30 DIAGNOSIS — M8580 Other specified disorders of bone density and structure, unspecified site: Secondary | ICD-10-CM | POA: Diagnosis not present

## 2020-08-30 DIAGNOSIS — E039 Hypothyroidism, unspecified: Secondary | ICD-10-CM | POA: Diagnosis not present

## 2020-08-30 DIAGNOSIS — E785 Hyperlipidemia, unspecified: Secondary | ICD-10-CM | POA: Diagnosis not present

## 2020-08-30 DIAGNOSIS — E1069 Type 1 diabetes mellitus with other specified complication: Secondary | ICD-10-CM | POA: Diagnosis not present

## 2020-08-30 DIAGNOSIS — E875 Hyperkalemia: Secondary | ICD-10-CM | POA: Diagnosis not present

## 2020-08-30 DIAGNOSIS — I10 Essential (primary) hypertension: Secondary | ICD-10-CM | POA: Diagnosis not present

## 2020-08-30 DIAGNOSIS — F419 Anxiety disorder, unspecified: Secondary | ICD-10-CM | POA: Diagnosis not present

## 2020-08-30 DIAGNOSIS — E860 Dehydration: Secondary | ICD-10-CM | POA: Diagnosis not present

## 2020-08-30 DIAGNOSIS — E538 Deficiency of other specified B group vitamins: Secondary | ICD-10-CM | POA: Diagnosis not present

## 2020-08-30 DIAGNOSIS — E559 Vitamin D deficiency, unspecified: Secondary | ICD-10-CM | POA: Diagnosis not present

## 2020-08-30 DIAGNOSIS — Z9181 History of falling: Secondary | ICD-10-CM | POA: Diagnosis not present

## 2020-09-03 DIAGNOSIS — E1065 Type 1 diabetes mellitus with hyperglycemia: Secondary | ICD-10-CM | POA: Diagnosis not present

## 2020-09-03 DIAGNOSIS — Z794 Long term (current) use of insulin: Secondary | ICD-10-CM | POA: Diagnosis not present

## 2020-09-03 DIAGNOSIS — Z9641 Presence of insulin pump (external) (internal): Secondary | ICD-10-CM | POA: Diagnosis not present

## 2020-09-03 DIAGNOSIS — Z6827 Body mass index (BMI) 27.0-27.9, adult: Secondary | ICD-10-CM | POA: Diagnosis not present

## 2020-09-03 DIAGNOSIS — E11319 Type 2 diabetes mellitus with unspecified diabetic retinopathy without macular edema: Secondary | ICD-10-CM | POA: Diagnosis not present

## 2020-09-05 DIAGNOSIS — E875 Hyperkalemia: Secondary | ICD-10-CM | POA: Diagnosis not present

## 2020-09-05 DIAGNOSIS — I10 Essential (primary) hypertension: Secondary | ICD-10-CM | POA: Diagnosis not present

## 2020-09-05 DIAGNOSIS — F419 Anxiety disorder, unspecified: Secondary | ICD-10-CM | POA: Diagnosis not present

## 2020-09-05 DIAGNOSIS — E1065 Type 1 diabetes mellitus with hyperglycemia: Secondary | ICD-10-CM | POA: Diagnosis not present

## 2020-09-05 DIAGNOSIS — E10319 Type 1 diabetes mellitus with unspecified diabetic retinopathy without macular edema: Secondary | ICD-10-CM | POA: Diagnosis not present

## 2020-09-05 DIAGNOSIS — E860 Dehydration: Secondary | ICD-10-CM | POA: Diagnosis not present

## 2020-09-05 DIAGNOSIS — E10649 Type 1 diabetes mellitus with hypoglycemia without coma: Secondary | ICD-10-CM | POA: Diagnosis not present

## 2020-09-05 DIAGNOSIS — E1042 Type 1 diabetes mellitus with diabetic polyneuropathy: Secondary | ICD-10-CM | POA: Diagnosis not present

## 2020-09-05 DIAGNOSIS — M8580 Other specified disorders of bone density and structure, unspecified site: Secondary | ICD-10-CM | POA: Diagnosis not present

## 2020-09-15 DIAGNOSIS — E10649 Type 1 diabetes mellitus with hypoglycemia without coma: Secondary | ICD-10-CM | POA: Diagnosis not present

## 2020-09-15 DIAGNOSIS — E1042 Type 1 diabetes mellitus with diabetic polyneuropathy: Secondary | ICD-10-CM | POA: Diagnosis not present

## 2020-09-17 DIAGNOSIS — Z6827 Body mass index (BMI) 27.0-27.9, adult: Secondary | ICD-10-CM | POA: Diagnosis not present

## 2020-09-17 DIAGNOSIS — E785 Hyperlipidemia, unspecified: Secondary | ICD-10-CM | POA: Diagnosis not present

## 2020-09-17 DIAGNOSIS — Z794 Long term (current) use of insulin: Secondary | ICD-10-CM | POA: Diagnosis not present

## 2020-09-17 DIAGNOSIS — E1065 Type 1 diabetes mellitus with hyperglycemia: Secondary | ICD-10-CM | POA: Diagnosis not present

## 2020-09-17 DIAGNOSIS — Z9641 Presence of insulin pump (external) (internal): Secondary | ICD-10-CM | POA: Diagnosis not present

## 2020-09-17 DIAGNOSIS — E11319 Type 2 diabetes mellitus with unspecified diabetic retinopathy without macular edema: Secondary | ICD-10-CM | POA: Diagnosis not present

## 2020-09-18 DIAGNOSIS — F419 Anxiety disorder, unspecified: Secondary | ICD-10-CM | POA: Diagnosis not present

## 2020-09-18 DIAGNOSIS — M8580 Other specified disorders of bone density and structure, unspecified site: Secondary | ICD-10-CM | POA: Diagnosis not present

## 2020-09-18 DIAGNOSIS — E1065 Type 1 diabetes mellitus with hyperglycemia: Secondary | ICD-10-CM | POA: Diagnosis not present

## 2020-09-18 DIAGNOSIS — I10 Essential (primary) hypertension: Secondary | ICD-10-CM | POA: Diagnosis not present

## 2020-09-18 DIAGNOSIS — E1069 Type 1 diabetes mellitus with other specified complication: Secondary | ICD-10-CM | POA: Diagnosis not present

## 2020-09-18 DIAGNOSIS — E875 Hyperkalemia: Secondary | ICD-10-CM | POA: Diagnosis not present

## 2020-09-18 DIAGNOSIS — E559 Vitamin D deficiency, unspecified: Secondary | ICD-10-CM | POA: Diagnosis not present

## 2020-09-18 DIAGNOSIS — E039 Hypothyroidism, unspecified: Secondary | ICD-10-CM | POA: Diagnosis not present

## 2020-09-18 DIAGNOSIS — E785 Hyperlipidemia, unspecified: Secondary | ICD-10-CM | POA: Diagnosis not present

## 2020-09-18 DIAGNOSIS — Z9181 History of falling: Secondary | ICD-10-CM | POA: Diagnosis not present

## 2020-09-18 DIAGNOSIS — E10319 Type 1 diabetes mellitus with unspecified diabetic retinopathy without macular edema: Secondary | ICD-10-CM | POA: Diagnosis not present

## 2020-09-18 DIAGNOSIS — E538 Deficiency of other specified B group vitamins: Secondary | ICD-10-CM | POA: Diagnosis not present

## 2020-09-18 DIAGNOSIS — E860 Dehydration: Secondary | ICD-10-CM | POA: Diagnosis not present

## 2020-09-24 ENCOUNTER — Telehealth: Payer: Self-pay | Admitting: Nutrition

## 2020-09-24 DIAGNOSIS — E1065 Type 1 diabetes mellitus with hyperglycemia: Secondary | ICD-10-CM | POA: Diagnosis not present

## 2020-09-24 DIAGNOSIS — Z794 Long term (current) use of insulin: Secondary | ICD-10-CM | POA: Diagnosis not present

## 2020-09-24 DIAGNOSIS — Z9641 Presence of insulin pump (external) (internal): Secondary | ICD-10-CM | POA: Diagnosis not present

## 2020-09-24 DIAGNOSIS — E785 Hyperlipidemia, unspecified: Secondary | ICD-10-CM | POA: Diagnosis not present

## 2020-09-24 NOTE — Telephone Encounter (Signed)
Message left on both numbers to call to schedule pump training

## 2020-09-26 DIAGNOSIS — E538 Deficiency of other specified B group vitamins: Secondary | ICD-10-CM | POA: Diagnosis not present

## 2020-09-26 DIAGNOSIS — E785 Hyperlipidemia, unspecified: Secondary | ICD-10-CM | POA: Diagnosis not present

## 2020-09-26 DIAGNOSIS — E039 Hypothyroidism, unspecified: Secondary | ICD-10-CM | POA: Diagnosis not present

## 2020-09-26 DIAGNOSIS — E1069 Type 1 diabetes mellitus with other specified complication: Secondary | ICD-10-CM | POA: Diagnosis not present

## 2020-09-26 DIAGNOSIS — I10 Essential (primary) hypertension: Secondary | ICD-10-CM | POA: Diagnosis not present

## 2020-10-01 ENCOUNTER — Other Ambulatory Visit: Payer: Self-pay | Admitting: Family Medicine

## 2020-10-01 DIAGNOSIS — Z1231 Encounter for screening mammogram for malignant neoplasm of breast: Secondary | ICD-10-CM

## 2020-10-03 DIAGNOSIS — I10 Essential (primary) hypertension: Secondary | ICD-10-CM | POA: Diagnosis not present

## 2020-10-03 DIAGNOSIS — E785 Hyperlipidemia, unspecified: Secondary | ICD-10-CM | POA: Diagnosis not present

## 2020-10-03 DIAGNOSIS — E039 Hypothyroidism, unspecified: Secondary | ICD-10-CM | POA: Diagnosis not present

## 2020-10-03 DIAGNOSIS — Z0000 Encounter for general adult medical examination without abnormal findings: Secondary | ICD-10-CM | POA: Diagnosis not present

## 2020-10-03 DIAGNOSIS — E1069 Type 1 diabetes mellitus with other specified complication: Secondary | ICD-10-CM | POA: Diagnosis not present

## 2020-10-03 DIAGNOSIS — Z780 Asymptomatic menopausal state: Secondary | ICD-10-CM | POA: Diagnosis not present

## 2020-10-05 DIAGNOSIS — E1042 Type 1 diabetes mellitus with diabetic polyneuropathy: Secondary | ICD-10-CM | POA: Diagnosis not present

## 2020-10-05 DIAGNOSIS — E10649 Type 1 diabetes mellitus with hypoglycemia without coma: Secondary | ICD-10-CM | POA: Diagnosis not present

## 2020-10-06 DIAGNOSIS — L400 Psoriasis vulgaris: Secondary | ICD-10-CM | POA: Diagnosis not present

## 2020-10-07 DIAGNOSIS — L400 Psoriasis vulgaris: Secondary | ICD-10-CM | POA: Diagnosis not present

## 2020-10-14 NOTE — Telephone Encounter (Signed)
Appt. Scheduled for next Monday.

## 2020-10-15 ENCOUNTER — Ambulatory Visit
Admission: RE | Admit: 2020-10-15 | Discharge: 2020-10-15 | Disposition: A | Discharge status: Home / Self Care | Payer: PPO | Source: Ambulatory Visit | Attending: Family Medicine | Admitting: Family Medicine

## 2020-10-15 ENCOUNTER — Other Ambulatory Visit: Payer: Self-pay

## 2020-10-15 DIAGNOSIS — Z1231 Encounter for screening mammogram for malignant neoplasm of breast: Secondary | ICD-10-CM | POA: Diagnosis not present

## 2020-10-20 ENCOUNTER — Encounter: Payer: PPO | Admitting: Nutrition

## 2020-10-27 ENCOUNTER — Encounter: Payer: HMO | Attending: Family Medicine | Admitting: Nutrition

## 2020-10-27 ENCOUNTER — Other Ambulatory Visit: Payer: Self-pay

## 2020-10-27 DIAGNOSIS — Z029 Encounter for administrative examinations, unspecified: Secondary | ICD-10-CM | POA: Insufficient documentation

## 2020-10-27 DIAGNOSIS — E1165 Type 2 diabetes mellitus with hyperglycemia: Secondary | ICD-10-CM

## 2020-10-28 NOTE — Progress Notes (Signed)
Patient was identified by name and DOB.  She is here with her daughter to go back on pump therapy.  She has been off her Medtronic 630 pump for 9 months.   Setting were put into pump per office note from MD.  Basal rate: MN   I/C MN: , ISF: 40,  Timing 4 hours,  We reviewed how insulin delivery occurs on a pump, and the difference between basal and bolus delivery.  She is currently wearing a Dexcom and this was linked to her pump.  We also discussed the fact that these readings are not blood sugar, but interstitial readings and to not rely on these readings when treating a low blood sugar.  They reported good understanding of this.  We also discussed the significance of the arrow with the readings.  They did not know this.   She reports having taken her long acting insulin last night, so the pump was put in a 100% basal reduction mode until 9PM tonight.  This required that we take the patient out of control IQ mode.  I will call her tonight to make sure she can put the pump back in control IQ.  She agreed for this assistance.  She filled a syringe with Novolog insulin from her pens, which was more difficult and patient was very frustrated with this and and infusion set.  She inserted a 6 mm     Set into her upper left abdomen without difficulty. We reviewed different sites for her to use and the need to rotate sites appropriately.  She did not know any of this.  She was feeling overwhelmed with this information and so we reviewed only how to give a bolus, which she re demonstrated X3 correctly.  She will return on Wednesday to complete training.  Handouts given on how to bolus, and how to fill a cartridge.  She was strongly encouraged to review the manual and to call if questions.

## 2020-10-28 NOTE — Patient Instructions (Signed)
Read over handouts given and pump manual Call Tandem help line if questions.

## 2020-10-29 ENCOUNTER — Telehealth: Payer: Self-pay | Admitting: Nutrition

## 2020-10-29 ENCOUNTER — Encounter: Payer: HMO | Admitting: Nutrition

## 2020-10-29 ENCOUNTER — Other Ambulatory Visit: Payer: Self-pay

## 2020-10-29 DIAGNOSIS — E1165 Type 2 diabetes mellitus with hyperglycemia: Secondary | ICD-10-CM

## 2020-10-29 DIAGNOSIS — Z029 Encounter for administrative examinations, unspecified: Secondary | ICD-10-CM | POA: Diagnosis present

## 2020-10-29 NOTE — Telephone Encounter (Signed)
Patient reported that the pump had given her the alert that stopped the temp basal rate and that she was getting her basal insulin.  She was talked through turning the control IQ back on and that she had no difficulty with giving the bolus at lunch and supper.  She requested learning about other infusion sets, because she found that the one she is using, is too difficult for her.  I told her that I would have others here for her to use on Wednesday when she returns for further training.  She had no final questions.

## 2020-11-03 NOTE — Progress Notes (Signed)
Christina Chen is here with her daughter to finish the [employer] pump training.  She reports that she is having no difficulty giving boluses, or wearing/sleeping with the pump, or disconnecting from the infusion set when showering. She did however request to have an easier infusion set.  She was shown the 2 other models and she chose the True Steel 32mm set.  She did a cartridge change with some assistance from me, was shown how prime and insert the new set.  She  She reported linking this much better.  She was told to call the [employer] company and asked to be credited and switched to this set.  She was given samples of this infusion set until she gets the new ones.   We reviewed how/when to use the temp basal rate, sick day guidelines, starting a new sensor, high blood sugar protocol, and she reported good understanding of all of the above.  She was given handouts on all of the above topics as well as what to carry with her at all times, and when to order new infusion sets.  She signed the checklist as understanding all topics with no final questions.  She was encouraged to call her MD to see when she will need to be seen again.  They had no final questions.

## 2020-11-04 ENCOUNTER — Telehealth: Payer: Self-pay | Admitting: Nutrition

## 2020-11-04 NOTE — Telephone Encounter (Signed)
Message left on answering machine that patient was having difficulty using old infusion sets.  Requested help from me for directions on how to use these.  Says used up easier ones I had given her and needed help with these.   Tried calling back and had to leave a message with a call back number as well as where she can go to get this information-videos on Tandem web site, manual inside box of infusion sets, as well as Tandem help line

## 2020-11-05 NOTE — Patient Instructions (Signed)
Read over handouts given as well as manual. Call [employer]andem help line if questions. Call MD if blood sugars drop or remain over 200.

## 2020-11-07 NOTE — Telephone Encounter (Signed)
Pt. Reported that she called customer care and this problem was resolved.  She had not yet requested the switch to the easier infusions sets.  She was again reminded how to do this.

## 2021-07-26 ENCOUNTER — Ambulatory Visit
Admission: EM | Admit: 2021-07-26 | Discharge: 2021-07-26 | Disposition: A | Discharge status: Home / Self Care | Payer: HMO | Attending: Emergency Medicine | Admitting: Emergency Medicine

## 2021-07-26 ENCOUNTER — Other Ambulatory Visit: Payer: Self-pay

## 2021-07-26 ENCOUNTER — Encounter: Payer: Self-pay | Admitting: Emergency Medicine

## 2021-07-26 DIAGNOSIS — I10 Essential (primary) hypertension: Secondary | ICD-10-CM

## 2021-07-26 DIAGNOSIS — J069 Acute upper respiratory infection, unspecified: Secondary | ICD-10-CM | POA: Diagnosis not present

## 2021-07-26 NOTE — ED Triage Notes (Signed)
Provider triage  

## 2021-07-26 NOTE — ED Provider Notes (Addendum)
[facility]    CSN: [id number] Arrival date & time: 07/26/21  1301      History   Chief Complaint No chief complaint on file.   HPI Christina Chen is a 75 y.o. female.  Patient presents with 3-day history of congestion, sore throat, cough.  She states her cough is productive of yellow sputum.  She denies fever, chills, rash, shortness of breath, vomiting, diarrhea, or other symptoms.  Treatment attempted at home with Zyrtec.  Her medical history includes hypertension, diabetes, insulin pump, hypothyroidism, anemia, DJD, osteoporosis,.  The history is provided by the patient and medical records.   Past Medical History:  Diagnosis Date   Anemia    Anxiety    Arthritis    B12 deficiency    Degenerative joint disease of knee, left    Diabetes mellitus without complication (HCC)    Type I insulin pump   Hypercholesterolemia    Hyperlipidemia    Hypertension    Hypoglycemia    Hypothyroidism    Osteoporosis    Psoriasis    Thyroid disease     There are no problems to display for this patient.   Past Surgical History:  Procedure Laterality Date   APPENDECTOMY     CATARACT EXTRACTION W/PHACO Left 01/24/2018   Procedure: CATARACT EXTRACTION PHACO AND INTRAOCULAR LENS PLACEMENT (IOC);  Surgeon: Birder Robson, MD;  Location: [facility] ORS;  Service: Ophthalmology;  Laterality: Left;  [place] 01:28 AP% 15.3 CDE 13.61 Fluid pack lot # [id number]   CATARACT EXTRACTION W/PHACO Right 02/21/2018   Procedure: CATARACT EXTRACTION PHACO AND INTRAOCULAR LENS PLACEMENT (IOC);  Surgeon: Birder Robson, MD;  Location: [facility] ORS;  Service: Ophthalmology;  Laterality: Right;  [place] 00:46 AP% 10.9 CDE 5.00 Fluid pack lot # [id number]   CHOLECYSTECTOMY     COLONOSCOPY     COLONOSCOPY WITH PROPOFOL N/A 06/30/2018   Procedure: COLONOSCOPY WITH PROPOFOL;  Surgeon: Manya Silvas, MD;  Location: [facility] ENDOSCOPY;  Service: Endoscopy;  Laterality: N/A;   FOOT SURGERY     TONSILLECTOMY       OB History   No obstetric history on file.      Home Medications    Prior to Admission medications   Medication Sig Start Date End Date Taking? Authorizing Provider  acetaminophen (TYLENOL) 500 MG tablet Take 500 mg by mouth as needed.    [provider]  aspirin EC 81 MG tablet Take 81 mg by mouth daily.    [provider]  calcium carbonate (OSCAL) 1500 (600 Ca) MG TABS tablet Take by mouth daily.    [provider]  Calcium-Magnesium-Vitamin D (CALCIUM 1200+D3 PO) Take 1 tablet by mouth daily.    [provider]  cyanocobalamin 1000 MCG tablet Take 1,000 mcg by mouth daily.    [provider]  enalapril (VASOTEC) 2.5 MG tablet Take 2.5 mg by mouth daily.    [provider]  ferrous sulfate 325 (65 FE) MG EC tablet Take 325 mg by mouth at bedtime.    [provider]  folic acid (FOLVITE) 1 MG tablet Take 1 mg by mouth daily.    [provider]  guaiFENesin (HERBAL EXPEC PO) Take 1,200 mg by mouth.    [provider]  levothyroxine (SYNTHROID, LEVOTHROID) 88 MCG tablet Take 88 mcg by mouth See admin instructions. Take 88 mcg by mouth daily except DO NOT take on Sunday 10/19/18 Is now taking every day    [provider]  Loratadine (  CLARITIN) 10 MG CAPS Take 10 mg by mouth as needed.    [provider]  Menthol, Topical Analgesic, 2 % GEL Apply topically as needed.    [provider]  methotrexate (RHEUMATREX) 2.5 MG tablet Take 15 mg by mouth every Wednesday. Caution:Chemotherapy. Protect from light.     [provider]  NOVOLOG 100 UNIT/ML injection 70 Units by Other route See admin instructions. Use via insulin pump max of 70 units daily 12/28/17   [provider]  rosuvastatin (CRESTOR) 5 MG tablet Take 5 mg by mouth daily.    [provider]  TURMERIC PO Take by mouth daily.    [provider]  Vitamin D, Ergocalciferol, (DRISDOL) 50000  units CAPS capsule Take 50,000 Units by mouth every 30 (thirty) days.    [provider]    Family History Family History  Problem Relation Age of Onset   Breast cancer Other 61   Diabetes Father    Stroke Father    Cancer Sister    Lymphoma Sister    Colon polyps Sister    Diabetes Brother    Coronary artery disease Brother     Social History Social History   Tobacco Use   Smoking status: Never   Smokeless tobacco: Never  Vaping Use   Vaping Use: Never used  Substance Use Topics   Alcohol use: Not Currently     Allergies   Cisapride, Oxycodone, and Tramadol   Review of Systems Review of Systems  Constitutional:  Negative for chills and fever.  HENT:  Positive for congestion and sore throat. Negative for ear pain.   Respiratory:  Positive for cough. Negative for shortness of breath.   Cardiovascular:  Negative for chest pain and palpitations.  Gastrointestinal:  Negative for abdominal pain, diarrhea and vomiting.  Skin:  Negative for color change and rash.  All other systems reviewed and are negative.   Physical Exam Triage Vital Signs ED Triage Vitals  Enc Vitals Group     BP      Pulse      Resp      Temp      Temp src      SpO2      Weight      Height      Head Circumference      Peak Flow      Pain Score      Pain Loc      Pain Edu?      Excl. in [place]?    No data found.  Updated Vital Signs BP (!) 165/79 (BP Location: Left Arm)   Pulse 62   Temp 98.5 F (36.9 C) (Oral)   Resp 18   SpO2 94%   Visual Acuity Right Eye Distance:   Left Eye Distance:   Bilateral Distance:    Right Eye Near:   Left Eye Near:    Bilateral Near:     Physical Exam Vitals and nursing note reviewed.  Constitutional:      General: She is not in acute distress.    Appearance: She is well-developed. She is not ill-appearing.  HENT:     Head: Normocephalic and atraumatic.     Right Ear: Tympanic membrane normal.     Left Ear: Tympanic membrane  normal.     Nose: Nose normal.     Mouth/Throat:     Mouth: Mucous membranes are moist.     Pharynx: Oropharynx is clear.  Eyes:  Conjunctiva/sclera: Conjunctivae normal.  Cardiovascular:     Rate and Rhythm: Normal rate and regular rhythm.     Heart sounds: Normal heart sounds.  Pulmonary:     Effort: Pulmonary effort is normal. No respiratory distress.     Breath sounds: Normal breath sounds.  Abdominal:     Palpations: Abdomen is soft.     Tenderness: There is no abdominal tenderness.  Musculoskeletal:     Cervical back: Neck supple.  Skin:    General: Skin is warm and dry.  Neurological:     General: No focal deficit present.     Mental Status: She is alert and oriented to person, place, and time.  Psychiatric:        Mood and Affect: Mood normal.        Behavior: Behavior normal.     UC Treatments / Results  Labs (all labs ordered are listed, but only abnormal results are displayed) Labs Reviewed  NOVEL CORONAVIRUS, NAA    EKG   Radiology No results found.  Procedures Procedures (including critical care time)  Medications Ordered in UC Medications - No data to display  Initial Impression / Assessment and Plan / UC Course  I have reviewed the triage vital signs and the nursing notes.  Pertinent labs & imaging results that were available during my care of the patient were reviewed by me and considered in my medical decision making (see chart for details).  Viral URI.  Elevated blood pressure reading with hypertension.  Patient request codeine cough syrup; I explained to her that this is contraindicated in her age group and discussed risk for falls.  She declines prescription for [employer]  COVID pending.  Instructed patient to self quarantine per CDC guidelines.  Discussed symptomatic treatment including Tylenol, rest, hydration.  Instructed patient to follow up with PCP if symptoms are not improving.  Patient agrees to plan of care. Update: patient  declines COVID test.      Final Clinical Impressions(s) / UC Diagnoses   Final diagnoses:  Viral URI  Elevated blood pressure reading in office with diagnosis of hypertension     Discharge Instructions      Your COVID test is pending.  You should self quarantine until the test result is back.    Take Tylenol as needed for fever or discomfort.  Take plain Mucinex as needed for congestion.   Follow-up with your primary care provider if your symptoms are not improving.         ED Prescriptions   None    PDMP not reviewed this encounter.   Sharion Balloon, NP 07/26/21 1349    Sharion Balloon, NP 07/26/21 1351

## 2021-07-26 NOTE — Discharge Instructions (Addendum)
Your COVID test is pending.  You should self quarantine until the test result is back.    Take Tylenol as needed for fever or discomfort.  Take plain Mucinex as needed for congestion.   Follow-up with your primary care provider if your symptoms are not improving.

## 2021-10-06 ENCOUNTER — Other Ambulatory Visit: Payer: Self-pay | Admitting: Family Medicine

## 2021-10-06 DIAGNOSIS — Z1231 Encounter for screening mammogram for malignant neoplasm of breast: Secondary | ICD-10-CM

## 2022-04-15 ENCOUNTER — Ambulatory Visit
Admission: RE | Admit: 2022-04-15 | Discharge: 2022-04-15 | Disposition: A | Discharge status: Home / Self Care | Payer: HMO | Source: Ambulatory Visit | Attending: Family Medicine | Admitting: Family Medicine

## 2022-04-15 DIAGNOSIS — Z1231 Encounter for screening mammogram for malignant neoplasm of breast: Secondary | ICD-10-CM | POA: Diagnosis present

## 2022-08-10 ENCOUNTER — Ambulatory Visit
Admission: RE | Admit: 2022-08-10 | Discharge: 2022-08-10 | Disposition: A | Discharge status: Home / Self Care | Payer: HMO | Source: Ambulatory Visit | Attending: Family | Admitting: Family

## 2022-08-10 ENCOUNTER — Other Ambulatory Visit: Payer: Self-pay | Admitting: Family

## 2022-08-10 DIAGNOSIS — S60051A Contusion of right little finger without damage to nail, initial encounter: Secondary | ICD-10-CM | POA: Insufficient documentation

## 2023-02-22 ENCOUNTER — Ambulatory Visit (INDEPENDENT_AMBULATORY_CARE_PROVIDER_SITE_OTHER): Payer: PPO

## 2023-02-22 ENCOUNTER — Other Ambulatory Visit: Payer: PPO

## 2023-02-22 ENCOUNTER — Ambulatory Visit
Admission: RE | Admit: 2023-02-22 | Discharge: 2023-02-22 | Disposition: A | Discharge status: Home / Self Care | Payer: PPO | Source: Ambulatory Visit | Attending: Urgent Care | Admitting: Urgent Care

## 2023-02-22 VITALS — BP 123/75 | HR 58 | Temp 97.7°F | Resp 18

## 2023-02-22 DIAGNOSIS — W19XXXA Unspecified fall, initial encounter: Secondary | ICD-10-CM

## 2023-02-22 DIAGNOSIS — R0789 Other chest pain: Secondary | ICD-10-CM

## 2023-02-22 NOTE — ED Triage Notes (Signed)
Patient presents to UC for right sided rib pain since Saturday from a fall. States pain with cough or when taking a deep breath. Treating pain with Tylenol.

## 2023-02-22 NOTE — ED Provider Notes (Signed)
[facility]    CSN: [id number] Arrival date & time: 02/22/23  1034      History   Chief Complaint Chief Complaint  Patient presents with   Rib Injury    HPI Christina Chen is a 77 y.o. female.   HPI  Patient presents to urgent care for right-sided rib pain x 3 days following a fall at home.  She endorses pain with coughing or when taking a deep breath.  She has been treating her pain with Tylenol.  She expresses concern for a "cracked rib".  She denies any shortness of breath or difficulty breathing.  Past Medical History:  Diagnosis Date   Anemia    Anxiety    Arthritis    B12 deficiency    Degenerative joint disease of knee, left    Diabetes mellitus without complication (HCC)    Type I insulin pump   Hypercholesterolemia    Hyperlipidemia    Hypertension    Hypoglycemia    Hypothyroidism    Osteoporosis    Psoriasis    Thyroid disease     There are no problems to display for this patient.   Past Surgical History:  Procedure Laterality Date   APPENDECTOMY     CATARACT EXTRACTION W/PHACO Left 01/24/2018   Procedure: CATARACT EXTRACTION PHACO AND INTRAOCULAR LENS PLACEMENT (IOC);  Surgeon: Galen Manila, MD;  Location: [facility] ORS;  Service: Ophthalmology;  Laterality: Left;  [place] 01:28 AP% 15.3 CDE 13.61 Fluid pack lot # [id number]   CATARACT EXTRACTION W/PHACO Right 02/21/2018   Procedure: CATARACT EXTRACTION PHACO AND INTRAOCULAR LENS PLACEMENT (IOC);  Surgeon: Galen Manila, MD;  Location: [facility] ORS;  Service: Ophthalmology;  Laterality: Right;  [place] 00:46 AP% 10.9 CDE 5.00 Fluid pack lot # [id number]   CHOLECYSTECTOMY     COLONOSCOPY     COLONOSCOPY WITH PROPOFOL N/A 06/30/2018   Procedure: COLONOSCOPY WITH PROPOFOL;  Surgeon: Scot Jun, MD;  Location: [facility] ENDOSCOPY;  Service: Endoscopy;  Laterality: N/A;   FOOT SURGERY     TONSILLECTOMY      OB History   No obstetric history on file.      Home Medications    Prior to Admission  medications   Medication Sig Start Date End Date Taking? Authorizing Provider  acetaminophen (TYLENOL) 500 MG tablet Take 500 mg by mouth as needed.    [provider]  aspirin EC 81 MG tablet Take 81 mg by mouth daily.    [provider]  calcium carbonate (OSCAL) 1500 (600 Ca) MG TABS tablet Take by mouth daily.    [provider]  Calcium-Magnesium-Vitamin D (CALCIUM 1200+D3 PO) Take 1 tablet by mouth daily.    [provider]  cyanocobalamin 1000 MCG tablet Take 1,000 mcg by mouth daily.    [provider]  enalapril (VASOTEC) 2.5 MG tablet Take 2.5 mg by mouth daily.    [provider]  ferrous sulfate 325 (65 FE) MG EC tablet Take 325 mg by mouth at bedtime.    [provider]  folic acid (FOLVITE) 1 MG tablet Take 1 mg by mouth daily.    [provider]  guaiFENesin (HERBAL EXPEC PO) Take 1,200 mg by mouth.    [provider]  levothyroxine (SYNTHROID, LEVOTHROID) 88 MCG tablet Take 88 mcg by mouth See admin instructions. Take 88 mcg by mouth daily except DO NOT take on Sunday 10/19/18 Is now taking every day    [provider]  Loratadine (CLARITIN)  10 MG CAPS Take 10 mg by mouth as needed.    [provider]  Menthol, Topical Analgesic, 2 % GEL Apply topically as needed.    [provider]  methotrexate (RHEUMATREX) 2.5 MG tablet Take 15 mg by mouth every Wednesday. Caution:Chemotherapy. Protect from light.     [provider]  NOVOLOG 100 UNIT/ML injection 70 Units by Other route See admin instructions. Use via insulin pump max of 70 units daily 12/28/17   [provider]  rosuvastatin (CRESTOR) 5 MG tablet Take 5 mg by mouth daily.    [provider]  TURMERIC PO Take by mouth daily.    [provider]  Vitamin D, Ergocalciferol, (DRISDOL) 50000 units CAPS capsule Take 50,000 Units by mouth every 30 (thirty) days.    [provider]     Family History Family History  Problem Relation Age of Onset   Breast cancer Other 47   Diabetes Father    Stroke Father    Cancer Sister    Lymphoma Sister    Colon polyps Sister    Diabetes Brother    Coronary artery disease Brother     Social History Social History   Tobacco Use   Smoking status: Never   Smokeless tobacco: Never  Vaping Use   Vaping Use: Never used  Substance Use Topics   Alcohol use: Not Currently     Allergies   Cisapride, Codeine, Oxycodone, and Tramadol   Review of Systems Review of Systems   Physical Exam Triage Vital Signs ED Triage Vitals  Enc Vitals Group     BP      Pulse      Resp      Temp      Temp src      SpO2      Weight      Height      Head Circumference      Peak Flow      Pain Score      Pain Loc      Pain Edu?      Excl. in [place]?    No data found.  Updated Vital Signs There were no vitals taken for this visit.  Visual Acuity Right Eye Distance:   Left Eye Distance:   Bilateral Distance:    Right Eye Near:   Left Eye Near:    Bilateral Near:     Physical Exam Vitals reviewed.  Constitutional:      General: She is not in acute distress.    Appearance: Normal appearance. She is not ill-appearing.  Chest:     Chest wall: Tenderness present. No deformity or crepitus. There is no dullness to percussion.  Skin:    General: Skin is warm and dry.  Neurological:     General: No focal deficit present.     Mental Status: She is alert and oriented to person, place, and time.  Psychiatric:        Mood and Affect: Mood normal.        Behavior: Behavior normal.      UC Treatments / Results  Labs (all labs ordered are listed, but only abnormal results are displayed) Labs Reviewed - No data to display  EKG   Radiology No results found.  Procedures Procedures (including critical care time)  Medications Ordered in UC Medications - No data to display  Initial Impression / Assessment and Plan  / UC Course  I have reviewed the triage vital signs  and the nursing notes.  Pertinent labs & imaging results that were available during my care of the patient were reviewed by me and considered in my medical decision making (see chart for details).   Christina Chen is a 77 y.o. female presenting with R sided chest wall pain following a fall 3 days ago. Patient is afebrile without recent antipyretics, satting well on room air. Overall is very well appearing though non-toxic, well hydrated, without respiratory distress. Pulmonary exam is unremarkable.  Lungs CTAB without wheezing, rhonchi, rales.  There is right-sided chest wall tenderness at her axilla.  No crepitus.  Symmetrical chest expansion.  Chest wall is not dull to percussion.  X-ray was performed at the patient's request to evaluate possibility of rib fracture.  "No fracture or other bone lesions are seen involving the ribs. There is no evidence of pneumothorax or pleural effusion. Both lungs are clear. Borderline cardiomegaly."  Patient is advised to use cold or warm therapy for symptom relief.  Topical creams and patches are available which may provide some analgesia.  Also recommended continued use of Tylenol as needed.  Patient is cautioned to breathe deeply to avoid increased risk of pneumonia.  Final Clinical Impressions(s) / UC Diagnoses   Final diagnoses:  Right-sided chest wall pain  Fall, initial encounter   Discharge Instructions   None    ED Prescriptions   None    PDMP not reviewed this encounter.   Charma Igo, [place] 02/22/23 1113

## 2023-02-22 NOTE — Discharge Instructions (Signed)
Use cold or warm therapy to relieve your pain.  Asked the pharmacist for recommended topical creams or patches which can provide pain relief.  Continue to use Tylenol as needed.  Be sure to breathe deeply to avoid increased risk of pneumonia.

## 2023-04-25 ENCOUNTER — Other Ambulatory Visit: Payer: Self-pay | Admitting: Family Medicine

## 2023-04-25 ENCOUNTER — Other Ambulatory Visit: Payer: Self-pay | Admitting: Geriatric Medicine

## 2023-04-25 DIAGNOSIS — Z1231 Encounter for screening mammogram for malignant neoplasm of breast: Secondary | ICD-10-CM

## 2023-05-04 ENCOUNTER — Ambulatory Visit
Admission: RE | Admit: 2023-05-04 | Discharge: 2023-05-04 | Disposition: A | Discharge status: Home / Self Care | Payer: PPO | Source: Ambulatory Visit | Attending: Geriatric Medicine | Admitting: Geriatric Medicine

## 2023-05-04 DIAGNOSIS — Z1231 Encounter for screening mammogram for malignant neoplasm of breast: Secondary | ICD-10-CM | POA: Insufficient documentation

## 2023-11-19 DEATH — deceased
